# Patient Record
Sex: Male | Born: 1965
Health system: Southern US, Community
[De-identification: ages and names within clinical notes are randomized; demographics above are authoritative.]

## PROBLEM LIST (undated history)

## (undated) DIAGNOSIS — K219 Gastro-esophageal reflux disease without esophagitis: Secondary | ICD-10-CM

## (undated) DIAGNOSIS — T7840XA Allergy, unspecified, initial encounter: Secondary | ICD-10-CM

## (undated) DIAGNOSIS — H8109 Meniere's disease, unspecified ear: Secondary | ICD-10-CM

## (undated) DIAGNOSIS — I10 Essential (primary) hypertension: Secondary | ICD-10-CM

## (undated) DIAGNOSIS — R7303 Prediabetes: Secondary | ICD-10-CM

## (undated) DIAGNOSIS — Z8619 Personal history of other infectious and parasitic diseases: Secondary | ICD-10-CM

## (undated) DIAGNOSIS — K469 Unspecified abdominal hernia without obstruction or gangrene: Secondary | ICD-10-CM

## (undated) DIAGNOSIS — E785 Hyperlipidemia, unspecified: Secondary | ICD-10-CM

## (undated) HISTORY — PX: HERNIA REPAIR: SHX51

## (undated) HISTORY — DX: Personal history of other infectious and parasitic diseases: Z86.19

## (undated) HISTORY — DX: Unspecified abdominal hernia without obstruction or gangrene: K46.9

## (undated) HISTORY — DX: Meniere's disease, unspecified ear: H81.09

## (undated) HISTORY — DX: Prediabetes: R73.03

## (undated) HISTORY — DX: Hyperlipidemia, unspecified: E78.5

## (undated) HISTORY — DX: Gastro-esophageal reflux disease without esophagitis: K21.9

## (undated) HISTORY — DX: Allergy, unspecified, initial encounter: T78.40XA

## (undated) HISTORY — DX: Essential (primary) hypertension: I10

---

## 2008-12-05 ENCOUNTER — Encounter: Payer: Self-pay | Admitting: Family Medicine

## 2009-01-01 ENCOUNTER — Ambulatory Visit: Payer: Self-pay | Admitting: Family Medicine

## 2009-01-01 DIAGNOSIS — R51 Headache: Secondary | ICD-10-CM | POA: Insufficient documentation

## 2009-01-01 DIAGNOSIS — R519 Headache, unspecified: Secondary | ICD-10-CM | POA: Insufficient documentation

## 2009-01-01 DIAGNOSIS — I10 Essential (primary) hypertension: Secondary | ICD-10-CM | POA: Insufficient documentation

## 2009-02-05 ENCOUNTER — Ambulatory Visit: Payer: Self-pay | Admitting: Family Medicine

## 2009-02-05 DIAGNOSIS — E785 Hyperlipidemia, unspecified: Secondary | ICD-10-CM | POA: Insufficient documentation

## 2009-02-05 DIAGNOSIS — K4090 Unilateral inguinal hernia, without obstruction or gangrene, not specified as recurrent: Secondary | ICD-10-CM | POA: Insufficient documentation

## 2009-05-07 ENCOUNTER — Ambulatory Visit: Payer: Self-pay | Admitting: Family Medicine

## 2009-05-07 DIAGNOSIS — N529 Male erectile dysfunction, unspecified: Secondary | ICD-10-CM | POA: Insufficient documentation

## 2009-05-07 DIAGNOSIS — N410 Acute prostatitis: Secondary | ICD-10-CM | POA: Insufficient documentation

## 2009-05-07 DIAGNOSIS — R369 Urethral discharge, unspecified: Secondary | ICD-10-CM | POA: Insufficient documentation

## 2009-05-12 LAB — CONVERTED CEMR LAB
Chlamydia, DNA Probe: NEGATIVE
GC Probe Amp, Genital: NEGATIVE

## 2009-06-02 ENCOUNTER — Ambulatory Visit: Payer: Self-pay | Admitting: Surgery

## 2009-10-21 ENCOUNTER — Telehealth (INDEPENDENT_AMBULATORY_CARE_PROVIDER_SITE_OTHER): Payer: Self-pay | Admitting: *Deleted

## 2009-10-22 ENCOUNTER — Ambulatory Visit: Payer: Self-pay | Admitting: Family Medicine

## 2009-10-26 LAB — CONVERTED CEMR LAB
ALT: 26 units/L (ref 0–53)
AST: 21 units/L (ref 0–37)
BUN: 25 mg/dL — ABNORMAL HIGH (ref 6–23)
CO2: 29 meq/L (ref 19–32)
Chloride: 102 meq/L (ref 96–112)
Cholesterol: 202 mg/dL — ABNORMAL HIGH (ref 0–200)
Creatinine, Ser: 0.8 mg/dL (ref 0.4–1.5)
Direct LDL: 127.9 mg/dL
Glucose, Bld: 113 mg/dL — ABNORMAL HIGH (ref 70–99)
PSA: 2.47 ng/mL (ref 0.10–4.00)
Potassium: 4 meq/L (ref 3.5–5.1)
Total Protein: 7.2 g/dL (ref 6.0–8.3)

## 2009-10-29 ENCOUNTER — Ambulatory Visit: Payer: Self-pay | Admitting: Family Medicine

## 2009-10-31 ENCOUNTER — Telehealth: Payer: Self-pay | Admitting: Family Medicine

## 2009-11-14 ENCOUNTER — Encounter: Payer: Self-pay | Admitting: Family Medicine

## 2009-12-31 ENCOUNTER — Ambulatory Visit: Payer: Self-pay | Admitting: Family Medicine

## 2010-06-09 NOTE — Progress Notes (Signed)
Summary: prior auth needed for cialis  Phone Note From Pharmacy   Caller: [facility]*/ [employer] Summary of Call: Prior Berkley Harvey is needed for cialis, form is on your desk. Initial call taken by: Lowella Petties CMA,  October 31, 2009 3:00 PM  Follow-up for Phone Call        noted Follow-up by: Hannah Beat MD,  October 31, 2009 3:09 PM

## 2010-06-09 NOTE — Medication Information (Signed)
Summary: Denial for Cialis/[employer]  Denial for Cialis/[employer]   Imported By: Lanelle Bal 11/20/2009 11:23:33  _____________________________________________________________________  External Attachment:    Type:   Image     Comment:   External Document

## 2010-06-09 NOTE — Assessment & Plan Note (Signed)
Summary: CPX/DLO   Vital Signs:  Patient profile:   45 year old male Height:      67.75 inches Weight:      198.2 pounds BMI:     30.47 Temp:     98.6 degrees F oral Pulse rate:   76 / minute Pulse rhythm:   regular BP sitting:   140 / 98  (left arm) Cuff size:   regular  Vitals Entered By: Benny Lennert CMA Duncan Dull) (October 29, 2009 2:33 PM)  History of Present Illness: Chief complaint cpx  HTN: will recheck, 145/100. The patient is to use been compliant with his medications, liver his bottle indicates it was last filled in February. He has concerns that his current hydrochlorothiazide may be impairing his erections.  Prostatitis, the patient has some complaints about a pain  in the perineal area, and also notably has some discomfort at times during ejaculation. He is had this in the past, and I previously did treat him with Cipro for a month, and he states he had no symptoms during that treatment time period, however they also believe did return. no penile discharge, previous gonorrhea and Chlamydia testing is been normal.  ED, extensively discussed. The patient is able to achieve a satisfactory erection, however he does  lose his erection  often,  in  sometimes is unable to complete the act of intercourse. He is able to ejaculate.  He also has some concerns and some mild pain around his inguinal area  and locations prior  hernia which had been present for approximately 20 years  CPX:  Sometimes wil get an erection and sometimes else will   Preventive Screening-Counseling & Management  Alcohol-Tobacco     Alcohol drinks/day: 0     Alcohol Counseling: not indicated; patient does not drink     Smoking Status: never     Tobacco Counseling: not indicated; no tobacco use  Caffeine-Diet-Exercise     Diet Counseling: to improve diet; diet is suboptimal     Does Patient Exercise: yes     Type of exercise: weights, some cardio     Times/week: <3  Hep-HIV-STD-Contraception  HIV Risk: no risk noted     STD Risk: no risk noted     Testicular SE Education/Counseling to perform regular STE      Sexual History:  currently monogamous.        Drug Use:  never.    Clinical Review Panels:  Prevention   Last PSA:  2.47 (10/22/2009)  Immunizations   Last Flu Vaccine:  Fluvax 3+ (02/05/2009)  Lipid Management   Cholesterol:  202 (10/22/2009)   HDL (good cholesterol):  43.40 (10/22/2009)  Complete Metabolic Panel   Glucose:  113 (10/22/2009)   Sodium:  139 (10/22/2009)   Potassium:  4.0 (10/22/2009)   Chloride:  102 (10/22/2009)   CO2:  29 (10/22/2009)   BUN:  25 (10/22/2009)   Creatinine:  0.8 (10/22/2009)   Albumin:  4.6 (10/22/2009)   Total Protein:  7.2 (10/22/2009)   Calcium:  9.5 (10/22/2009)   Total Bili:  1.0 (10/22/2009)   Alk Phos:  69 (10/22/2009)   SGPT (ALT):  26 (10/22/2009)   SGOT (AST):  21 (10/22/2009)   Allergies (verified): No Known Drug Allergies  Past History:  Past medical, surgical, family and social histories (including risk factors) reviewed, and no changes noted (except as noted below).  Past Medical History: Chicken pox Hypertension large, right inguinal hernia, s/p repair Hyperlipidemia  Past Surgical History: Inguinal  herniorrhaphy  Family History: Reviewed history from 01/01/2009 and no changes required. Family History Breast cancer 1st degree relative <50 Family History Diabetes 1st degree relative Family History Hypertension  Social History: Reviewed history from 01/01/2009 and no changes required. Occupation: [employer] Married Never Smoked Alcohol use-yes Drug use-no Regular exercise-yes HIV Risk:  no risk noted STD Risk:  no risk noted Sexual History:  currently monogamous Drug Use:  never  Review of Systems      See HPI General:  Denies chills and fever. Eyes:  Denies blurring and eye irritation. ENT:  Denies postnasal drainage, sinus pressure, and sore throat. CV:  Denies chest pain  or discomfort and shortness of breath with exertion. Resp:  Denies cough and wheezing. GI:  Denies abdominal pain, bloody stools, dark tarry stools, nausea, and vomiting. GU:  Complains of decreased libido and erectile dysfunction; denies discharge, dysuria, genital sores, urinary frequency, and urinary hesitancy. MS:  Denies joint pain. Derm:  Denies rash. Neuro:  Complains of headaches; denies tingling. Psych:  Denies depression. Endo:  Denies excessive hunger, excessive thirst, and polyuria. Heme:  Denies enlarge lymph nodes and skin discoloration. Allergy:  Denies persistent infections and seasonal allergies.  Physical Exam  General:  Well-developed,well-nourished,in no acute distress; alert,appropriate and cooperative throughout examination Head:  Normocephalic and atraumatic without obvious abnormalities. No apparent alopecia or balding. Eyes:  pupils equal, pupils round, pupils reactive to light, pupils react to accomodation, and corneas and lenses clear.   Ears:  External ear exam shows no significant lesions or deformities.  Otoscopic examination reveals clear canals, tympanic membranes are intact bilaterally without bulging, retraction, inflammation or discharge. Hearing is grossly normal bilaterally. Nose:  External nasal examination shows no deformity or inflammation. Nasal mucosa are pink and moist without lesions or exudates. Mouth:  Oral mucosa and oropharynx without lesions or exudates.  Teeth in good repair. Neck:  No deformities, masses, or tenderness noted. Chest Wall:  No deformities, masses, tenderness or gynecomastia noted. Lungs:  Normal respiratory effort, chest expands symmetrically. Lungs are clear to auscultation, no crackles or wheezes. Heart:  Normal rate and regular rhythm. S1 and S2 normal without gallop, murmur, click, rub or other extra sounds. Abdomen:  Bowel sounds positive,abdomen soft and non-tender without masses, organomegaly or hernias noted. incisions  look good, well healed. Rectal:  No external abnormalities noted. Normal sphincter tone. No rectal masses or tenderness. Genitalia:  no cutaneous lesions and no urethral discharge.   Large R testicle, but no palpable lesion or mass on surface - area of prior large hernia with pointing to area of inguinal canal for area of concern for some pain Prostate:  large prostate, mildly tender to palpation, not hot. no nodules. Msk:  normal ROM and no crepitation.   Extremities:  No clubbing, cyanosis, edema, or deformity noted with normal full range of motion of all joints.   Neurologic:  alert & oriented X3, sensation intact to light touch, and gait normal.   Skin:  Intact without suspicious lesions or rashes Cervical Nodes:  No lymphadenopathy noted Inguinal Nodes:  No significant adenopathy Psych:  moderately anxious appearing. normally interactive, good eye contact, not depressed appearing, and not agitated.     Impression & Recommendations:  Problem # 1:  HEALTH MAINTENANCE EXAM (ICD-V70.0) Assessment New The patient's preventative maintenance and recommended screening tests for an annual wellness exam were reviewed in full today. Brought up to date unless services declined.  Counselled on the importance of diet, exercise, and its role  in overall health and mortality. The patient's FH and SH was reviewed, including their home life, tobacco status, and drug and alcohol status.   Prediabetic and obese -- counselled about exercise patterns, diet, and weight loss.  Problem # 2:  ORGANIC IMPOTENCE (ZOX-096.04) Assessment: New >30 minutes spent in face to face time with patient in counselling: we underwent extensive counseling about erectile dysfunction, reasons for organic and psychogenic impotence. The also discussed hypertensive agents and their potential role in this. We also did discuss various treatment options including  Cialis,Levitra, Viagra, and daily Cialis. counseling time done in total  including  counseling about blood pressure and hypertensive agents.  The patient appeared highly anxious. Likely organic eminence, however but I cannot rule out a significant component of psychogenic impotence as well.  His updated medication list for this problem includes:    Cialis 20 Mg Tabs (Tadalafil) .Marland Kitchen... 1 by mouth prior to intercourse (sample pack)    Cialis 20 Mg Tabs (Tadalafil) .Marland Kitchen... 1 by mouth prior to intercourse  Problem # 3:  PROSTATITIS, ACUTE, CHRONIC (ICD-601.0) Assessment: Deteriorated mildly tender prostate on exam, and certainly has symptoms that will go along with this, so we'll treat as such. The patient has multiple genital complaints, and multiple sexual complaints, this may be  sexual dysfunction issue and sexual pain issue,  and if this does not resolve after this course of ciprofloxacin, I willhave him see urology.  Problem # 4:  HYPERTENSION (ICD-401.9) Assessment: Deteriorated unstable, previously has tried lisinopril and hydrochlorothiazide, both of which the patient was concerned were causing  erectile difficulties.  Will change to Norvasc to attempt to achieve good blood pressure control.  The following medications were removed from the medication list:    Hydrochlorothiazide 25 Mg Tabs (Hydrochlorothiazide) .Marland Kitchen... 1 by mouth daily His updated medication list for this problem includes:    Amlodipine Besylate 10 Mg Tabs (Amlodipine besylate) .Marland Kitchen... 1 by mouth daily  Problem # 5:  INGUINAL HERNIA (ICD-550.90) Assessment: Improved repair site looks good, reassured the patient.  Complete Medication List: 1)  One-a-day Mens Tabs (Multiple vitamin) .... One a day 2)  Amlodipine Besylate 10 Mg Tabs (Amlodipine besylate) .Marland Kitchen.. 1 by mouth daily 3)  Cialis 20 Mg Tabs (Tadalafil) .Marland Kitchen.. 1 by mouth prior to intercourse (sample pack) 4)  Cialis 20 Mg Tabs (Tadalafil) .Marland Kitchen.. 1 by mouth prior to intercourse 5)  Cipro 500 Mg Tabs (Ciprofloxacin hcl) .Marland Kitchen.. 1 by mouth 2 times  daily Prescriptions: CIPRO 500 MG TABS (CIPROFLOXACIN HCL) 1 by mouth 2 times daily  #60 x 0   Entered and Authorized by:   Hannah Beat MD   Signed by:   Hannah Beat MD on 10/29/2009   Method used:   Electronically to        [facility]* (retail)       [address]       [place], [place]  [address]       Ph: [phone]       Fax: [phone]   RxID:   [id number] CIALIS 20 MG TABS (TADALAFIL) 1 by mouth prior to intercourse  #10 x 11   Entered and Authorized by:   Hannah Beat MD   Signed by:   Hannah Beat MD on 10/29/2009   Method used:   Print then Give to Patient   RxID:   [id number] CIALIS 20 MG TABS (TADALAFIL) 1 by mouth prior to intercourse (sample pack)  #3 x 0   Entered and Authorized by:  Hannah Beat MD   Signed by:   Hannah Beat MD on 10/29/2009   Method used:   Electronically to        [facility]* (retail)       [address]       [place], [place]  [address]       Ph: [phone]       Fax: [phone]   RxID:   [id number] AMLODIPINE BESYLATE 10 MG TABS (AMLODIPINE BESYLATE) 1 by mouth daily  #30 x 11   Entered and Authorized by:   Hannah Beat MD   Signed by:   Hannah Beat MD on 10/29/2009   Method used:   Electronically to        [facility]* (retail)       [address]       [place], [place]  [address]       Ph: [phone]       Fax: [phone]   RxID:   [id number]   Current Allergies (reviewed today): No known allergies

## 2010-06-09 NOTE — Assessment & Plan Note (Signed)
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 ED: good response to cialis  3. prostatitis: continues with questions, but is asymptomatic. No problems after course of cipro   REVIEW OF SYSTEMS GEN: No acute illnesses, no fever, chills, sweats. CV: No chest pain or SOB GI: No noted N or V Otherwise, pertinent positives and negatives are noted in the HPI.   GEN: WDWN, NAD, Non-toxic, A & O x 3 HEENT: Atraumatic, Normocephalic. Neck supple. No masses, No LAD. Ears and Nose: No external deformity. CV: RRR, No M/G/R. No JVD. No thrill. No extra heart sounds. PULM: CTA B, no wheezes, crackles, rhonchi. No retractions. No resp. distress. No accessory muscle use.  EXTR: No c/c/e NEURO: Normal gait.  PSYCH: Normally interactive. Conversant. Not depressed or anxious appearing.  Calm demeanor.    Allergies (verified): No Known Drug Allergies  Past History:  Past medical, surgical, family and social histories (including risk factors) reviewed, and no changes noted (except as noted below).  Past Medical History: Reviewed history from 10/29/2009 and no changes required. Chicken pox Hypertension large, right inguinal hernia, s/p repair Hyperlipidemia  Past Surgical History: Reviewed history from 10/29/2009 and no changes required. Inguinal herniorrhaphy  Family History: Reviewed history from 01/01/2009 and no changes required. Family History Breast cancer 1st degree  relative <50 Family History Diabetes 1st degree relative Family History Hypertension  Social History: Reviewed history from 01/01/2009 and no changes required. Occupation: [employer] Married Never Smoked Alcohol use-yes Drug use-no Regular exercise-yes   Impression & Recommendations:  Problem # 1:  HYPERTENSION (ICD-401.9) Assessment Deteriorated add toprol  His updated medication list for this problem includes:    Amlodipine Besylate 10 Mg Tabs (Amlodipine besylate) .Marland Kitchen... 1 by mouth daily    Metoprolol Tartrate 25 Mg Tabs (Metoprolol tartrate) .Marland Kitchen... 1/2 tablet by mouth two times a day  Problem # 2:  PROSTATITIS, ACUTE, CHRONIC (ICD-601.0) Assessment: Improved resolved  Problem # 3:  ORGANIC IMPOTENCE (WUX-324.40) Assessment: Improved  The following medications were removed from the medication list:    Cialis 20 Mg Tabs (Tadalafil) .Marland Kitchen... 1 by mouth prior to intercourse (sample pack) His updated medication list for this problem includes:    Cialis 20 Mg Tabs (Tadalafil) .Marland Kitchen... 1 by mouth prior to intercourse  Complete Medication List: 1)  Amlodipine Besylate 10 Mg Tabs (Amlodipine besylate) .Marland Kitchen.. 1 by mouth daily 2)  Cialis 20 Mg Tabs (Tadalafil) .Marland Kitchen.. 1 by mouth prior to intercourse 3)  Metoprolol Tartrate 25 Mg Tabs (Metoprolol tartrate) .... 1/2 tablet by mouth two times a day  Patient Instructions: 1)  Metoprolol: 1/2 tablet in the morning and 1/2 tablet in the late afternoon Prescriptions: METOPROLOL TARTRATE 25 MG TABS (METOPROLOL TARTRATE) 1/2 tablet by mouth two times a day  #30 x 11   Entered and  Authorized by:   Hannah Beat MD   Signed by:   Hannah Beat MD on 12/31/2009   Method used:   Electronically to        [facility]* (retail)       [address]       [place], [place]  [address]       Ph: [phone]       Fax: [phone]   RxID:   [id number]   Current Allergies (reviewed today): No known allergies

## 2010-06-09 NOTE — Progress Notes (Signed)
----   Converted from flag ---- ---- 10/21/2009 11:43 AM, Kerby Nora MD wrote: CMET, lipids Dx v77.91  ---- 10/21/2009 11:32 AM, Mills Koller wrote: Dr Cyndie Chime pt-CPX lab order, please. Terri ------------------------------

## 2010-06-12 ENCOUNTER — Encounter: Payer: Self-pay | Admitting: Family Medicine

## 2010-06-12 ENCOUNTER — Ambulatory Visit (INDEPENDENT_AMBULATORY_CARE_PROVIDER_SITE_OTHER): Payer: Commercial Indemnity | Admitting: Family Medicine

## 2010-06-12 DIAGNOSIS — J019 Acute sinusitis, unspecified: Secondary | ICD-10-CM

## 2010-06-17 NOTE — Assessment & Plan Note (Signed)
Summary: ?SINUS INFECTION/CLE [employer]   Vital Signs:  Patient profile:   45 year old male Weight:      202 pounds Temp:     98.4 degrees F oral Pulse rate:   60 / minute Pulse rhythm:   regular BP sitting:   118 / 80  (left arm) Cuff size:   large  Vitals Entered By: Selena Batten Dance CMA Duncan Dull) (June 12, 2010 4:29 PM) CC: ? SInus infection x1 1/2 weeks Comments Took 1 Avelox this AM that his wife had   History of Present Illness: CC: sinus pressure?  1 1/2 wk h/o sinus pressure in head.  Actually sxs started 3-4 wks ago, took zpack and got better.  then wife got it and now pt starting to get sxs again.  Tried OTC meds - allergy which have helped some.  + tooth pain and ears popping.  + PNdrip.  + yellow discharge from nose.  very mild cough.  pressure worse with bending head forward.  No fevers/chills, abd pain, n/v/d, rashes, chest pain, ear pain.  No ST.  No smokers at home, no asthma.  Current Medications (verified): 1)  Amlodipine Besylate 10 Mg Tabs (Amlodipine Besylate) .Marland Kitchen.. 1 By Mouth Daily 2)  Metoprolol Tartrate 25 Mg Tabs (Metoprolol Tartrate) .... 1/2 Tablet By Mouth Two Times A Day  Allergies (verified): 1)  ! Penicillin  Past History:  Past Medical History: Last updated: 10/29/2009 Chicken pox Hypertension large, right inguinal hernia, s/p repair Hyperlipidemia  Social History: Last updated: 01/01/2009 Occupation: [employer] Married Never Smoked Alcohol use-yes Drug use-no Regular exercise-yes  Review of Systems       per HPI  Physical Exam  General:  Well-developed,well-nourished,in no acute distress; alert,appropriate and cooperative throughout examination Head:  Normocephalic and atraumatic without obvious abnormalities. no sinus tenderness Eyes:  pupils equal, pupils round, pupils reactive to light, pupils react to accomodation, and corneas and lenses clear.   Ears:  TMs clear bilaterally Nose:  nares clear bilaterally, some  congestion Mouth:  MMM, mild pharyngeal erythema bilaterally Neck:  No deformities, masses, or tenderness noted.  no LAD Lungs:  Normal respiratory effort, chest expands symmetrically. Lungs are clear to auscultation, no crackles or wheezes. Heart:  Normal rate and regular rhythm. S1 and S2 normal without gallop, murmur, click, rub or other extra sounds. Pulses:  2+ rad pulses, brisk cap refill Extremities:  no pedal edema Skin:  Intact without suspicious lesions or rashes   Impression & Recommendations:  Problem # 1:  SINUSITIS, ACUTE (ICD-461.9) Assessment New advised against taking other's abx.  advised to tell wife to complete all abx course.  possible mild early sinus infection, going on past 10 days, treat.  advised to complete abx course.  supportive care as per instructions. His updated medication list for this problem includes:    Bactrim Ds 800-160 Mg Tabs (Sulfamethoxazole-trimethoprim) .Marland Kitchen... Take one by mouth two times a day x 10 days  Complete Medication List: 1)  Amlodipine Besylate 10 Mg Tabs (Amlodipine besylate) .Marland Kitchen.. 1 by mouth daily 2)  Metoprolol Tartrate 25 Mg Tabs (Metoprolol tartrate) .... 1/2 tablet by mouth two times a day 3)  Bactrim Ds 800-160 Mg Tabs (Sulfamethoxazole-trimethoprim) .... Take one by mouth two times a day x 10 days  Patient Instructions: 1)  You have a sinus infection. 2)  Take medicines as prescribed: bactrim DS twice daily for 10 days 3)  Take simple mucinex with plenty of fluid to help mobilize mucous. 4)  Use  nasal saline spray to help drainage of sinuses. 5)  If you start having fevers >101.5, trouble swallowing or breathing, or are worsening instead of improving as expected, you may need to be seen again. 6)  Good to see you today, call clinic with questions.  Prescriptions: BACTRIM DS 800-160 MG TABS (SULFAMETHOXAZOLE-TRIMETHOPRIM) take one by mouth two times a day x 10 days  #20 x 0   Entered and Authorized by:   Eustaquio Boyden  MD    Signed by:   Eustaquio Boyden  MD on 06/12/2010   Method used:   Electronically to        [facility]* (retail)       [address]       [place], [place]  [address]       Ph: [phone]       Fax: [phone]   RxID:   [id number]    Orders Added: 1)  Est. Patient Level III [75643]    Current Allergies (reviewed today): ! PENICILLIN

## 2010-10-23 ENCOUNTER — Other Ambulatory Visit: Payer: Self-pay | Admitting: *Deleted

## 2010-10-23 MED ORDER — AMLODIPINE BESYLATE 10 MG PO TABS: 10.0000 mg | ORAL_TABLET | Freq: Every day | ORAL | Status: DC

## 2010-12-23 ENCOUNTER — Other Ambulatory Visit: Payer: Self-pay | Admitting: *Deleted

## 2010-12-23 MED ORDER — AMLODIPINE BESYLATE 10 MG PO TABS: 10.0000 mg | ORAL_TABLET | Freq: Every day | ORAL | Status: DC

## 2010-12-28 ENCOUNTER — Other Ambulatory Visit: Payer: Self-pay | Admitting: *Deleted

## 2010-12-30 MED ORDER — METOPROLOL TARTRATE 25 MG PO TABS: 12.5000 mg | ORAL_TABLET | Freq: Two times a day (BID) | ORAL | Status: DC

## 2010-12-31 ENCOUNTER — Other Ambulatory Visit: Payer: Self-pay | Admitting: *Deleted

## 2011-02-09 ENCOUNTER — Other Ambulatory Visit (INDEPENDENT_AMBULATORY_CARE_PROVIDER_SITE_OTHER): Payer: Commercial Indemnity

## 2011-02-09 DIAGNOSIS — E785 Hyperlipidemia, unspecified: Secondary | ICD-10-CM

## 2011-02-09 DIAGNOSIS — Z125 Encounter for screening for malignant neoplasm of prostate: Secondary | ICD-10-CM

## 2011-02-09 DIAGNOSIS — Z79899 Other long term (current) drug therapy: Secondary | ICD-10-CM

## 2011-02-09 LAB — HEPATIC FUNCTION PANEL
ALT: 22 U/L (ref 0–53)
AST: 18 U/L (ref 0–37)
Albumin: 4.2 g/dL (ref 3.5–5.2)
Alkaline Phosphatase: 68 U/L (ref 39–117)
Total Protein: 6.8 g/dL (ref 6.0–8.3)

## 2011-02-09 LAB — BASIC METABOLIC PANEL
BUN: 26 mg/dL — ABNORMAL HIGH (ref 6–23)
CO2: 27 mEq/L (ref 19–32)
GFR: 86.18 mL/min (ref >60.00)
Glucose, Bld: 116 mg/dL — ABNORMAL HIGH (ref 70–99)
Potassium: 4.2 mEq/L (ref 3.5–5.1)
Sodium: 141 mEq/L (ref 135–145)

## 2011-02-09 LAB — CBC WITH DIFFERENTIAL/PLATELET
Basophils Relative: 0.5 % (ref 0.0–3.0)
Eosinophils Relative: 1.2 % (ref 0.0–5.0)
HCT: 45.6 % (ref 39.0–52.0)
MCV: 87.8 fl (ref 78.0–100.0)
Monocytes Absolute: 0.4 10*3/uL (ref 0.1–1.0)
Monocytes Relative: 7.7 % (ref 3.0–12.0)
Neutrophils Relative %: 56.7 % (ref 43.0–77.0)
RBC: 5.2 Mil/uL (ref 4.22–5.81)
WBC: 5.5 10*3/uL (ref 4.5–10.5)

## 2011-02-09 LAB — LIPID PANEL: Total CHOL/HDL Ratio: 4

## 2011-02-10 ENCOUNTER — Other Ambulatory Visit: Payer: Commercial Indemnity

## 2011-02-12 ENCOUNTER — Encounter: Payer: Self-pay | Admitting: Family Medicine

## 2011-02-15 ENCOUNTER — Ambulatory Visit (INDEPENDENT_AMBULATORY_CARE_PROVIDER_SITE_OTHER): Payer: Commercial Indemnity | Admitting: Family Medicine

## 2011-02-15 ENCOUNTER — Encounter: Payer: Self-pay | Admitting: Family Medicine

## 2011-02-15 VITALS — BP 120/70 | HR 58 | Temp 98.7°F | Ht 68.0 in | Wt 197.8 lb

## 2011-02-15 DIAGNOSIS — N529 Male erectile dysfunction, unspecified: Secondary | ICD-10-CM

## 2011-02-15 DIAGNOSIS — Z0000 Encounter for general adult medical examination without abnormal findings: Secondary | ICD-10-CM

## 2011-02-15 DIAGNOSIS — Z23 Encounter for immunization: Secondary | ICD-10-CM

## 2011-02-15 DIAGNOSIS — N400 Enlarged prostate without lower urinary tract symptoms: Secondary | ICD-10-CM

## 2011-02-15 DIAGNOSIS — I10 Essential (primary) hypertension: Secondary | ICD-10-CM

## 2011-02-15 DIAGNOSIS — N410 Acute prostatitis: Secondary | ICD-10-CM

## 2011-02-15 DIAGNOSIS — R972 Elevated prostate specific antigen [PSA]: Secondary | ICD-10-CM

## 2011-02-15 MED ORDER — VARDENAFIL HCL 20 MG PO TABS: 20.0000 mg | ORAL_TABLET | ORAL | Status: DC | PRN

## 2011-02-15 MED ORDER — METOPROLOL TARTRATE 25 MG PO TABS: 25.0000 mg | ORAL_TABLET | Freq: Two times a day (BID) | ORAL | Status: DC

## 2011-02-15 MED ORDER — SULFAMETHOXAZOLE-TMP DS 800-160 MG PO TABS: 1.0000 | ORAL_TABLET | Freq: Two times a day (BID) | ORAL | Status: AC

## 2011-02-15 NOTE — Patient Instructions (Signed)
REFERRAL: GO THE THE FRONT ROOM AT THE ENTRANCE OF OUR CLINIC, NEAR CHECK IN. ASK FOR MARION. SHE WILL HELP YOU SET UP YOUR REFERRAL. DATE: TIME:  

## 2011-02-15 NOTE — Progress Notes (Signed)
Subjective:    Patient ID: [name], male    DOB: 26-May-1965, 45 y.o.   MRN: [id number]  HPI  Thomas Blair, a 45 y.o. male presents today in the office for the following:    Preventative Health Maintenance Visit:  Health Maintenance Summary Reviewed and updated, unless pt declines services.  Tobacco History Reviewed. Alcohol: No concerns, no excessive use Exercise Habits: Some activity, rec at least 30 mins 5 times a week STD concerns: no risk or activity to increase risk Drug Use: None Encouraged self-testicular check  Health Maintenance  Topic Date Due  . Tetanus/tdap  04/08/1983  . Influenza Vaccine  02/08/2011    Labs reviewed with the patient.   Lipids:    Component Value Date/Time   CHOL 164 02/09/2011 0834   TRIG 85.0 02/09/2011 0834   HDL 39.40 02/09/2011 0834   LDLDIRECT 127.9 10/22/2009 0840   VLDL 17.0 02/09/2011 0834   CHOLHDL 4 02/09/2011 0834    CBC:    Component Value Date/Time   WBC 5.5 02/09/2011 0834   HGB 15.3 02/09/2011 0834   HCT 45.6 02/09/2011 0834   PLT 178.0 02/09/2011 0834   MCV 87.8 02/09/2011 0834   NEUTROABS 3.1 02/09/2011 0834   LYMPHSABS 1.9 02/09/2011 0834   MONOABS 0.4 02/09/2011 0834   EOSABS 0.1 02/09/2011 0834   BASOSABS 0.0 02/09/2011 0834    Basic Metabolic Panel:    Component Value Date/Time   NA 141 02/09/2011 0834   K 4.2 02/09/2011 0834   CL 106 02/09/2011 0834   CO2 27 02/09/2011 0834   BUN 26* 02/09/2011 0834   CREATININE 1.0 02/09/2011 0834   GLUCOSE 116* 02/09/2011 0834   CALCIUM 9.2 02/09/2011 0834    Lab Results  Component Value Date   ALT 22 02/09/2011   AST 18 02/09/2011   ALKPHOS 68 02/09/2011   BILITOT 1.0 02/09/2011    Lab Results  Component Value Date   PSA 15.88* 02/09/2011   PSA 2.47 10/22/2009   Elevated PSA: The patient's PSA is increased to 16 from 2.5 one year ago. He does have some complaints of discomfort in around his prostate as well as in his perineal area. No pain with ejaculation or  urination.  Organic impotence, continues. Thomas Blair does work, but is not covered on his insurance. He like try one of the other products.  Hypertension: Elevated, classically has been slightly above 140/90 on his checks, and when I rechecked it in the office it was 145/95.    Review of Systems .General: Denies fever, chills, sweats. No significant weight loss. Eyes: Denies blurring,significant itching ENT: Denies earache, sore throat, and hoarseness. Cardiovascular: Denies chest pains, palpitations, dyspnea on exertion Respiratory: Denies cough, dyspnea at rest,wheeezing Breast: no concerns about lumps GI: Denies nausea, vomiting, diarrhea, constipation, change in bowel habits, abdominal pain, melena, hematochezia GU: above Musculoskeletal: Denies back pain, joint pain Derm: Denies rash, itching Neuro: Denies  paresthesias, frequent falls, frequent headaches Psych: Denies depression, anxiety Endocrine: Denies cold intolerance, heat intolerance, polydipsia Heme: Denies enlarged lymph nodes Allergy: No hayfever     Objective:   Physical Exam   Physical Exam  Blood pressure 120/70, pulse 58, temperature 98.7 F (37.1 C), temperature source Oral, height 5\' 8"  (1.727 m), weight 197 lb 12.8 oz (89.721 kg), SpO2 99.00%.  PE: GEN: well developed, well nourished, no acute distress Eyes: conjunctiva and lids normal, PERRLA, EOMI ENT: TM clear, nares clear, oral exam WNL Neck: supple, no lymphadenopathy, no thyromegaly,  no JVD Pulm: clear to auscultation and percussion, respiratory effort normal CV: regular rate and rhythm, S1-S2, no murmur, rub or gallop, no bruits, peripheral pulses normal and symmetric, no cyanosis, clubbing, edema or varicosities Chest: no scars, masses, no gynecomastia   GI: soft, non-tender; no hepatosplenomegaly, masses; active bowel sounds all quadrants GU: no hernia, testicular mass, penile discharge. Prostate is moderately enlarged and mildly boggy. It is also  mildly tender to palpation. Lymph: no cervical, axillary or inguinal adenopathy MSK: gait normal, muscle tone and strength WNL, no joint swelling, effusions, discoloration, crepitus  SKIN: clear, good turgor, color WNL, no rashes, lesions, or ulcerations Neuro: normal mental status, normal strength, sensation, and motion Psych: alert; oriented to person, place and time, normally interactive and not anxious or depressed in appearance.      Assessment & Plan:   1. Routine general medical examination at a health care facility : The patient's preventative maintenance and recommended screening tests for an annual wellness exam were reviewed in full today. Brought up to date unless services declined.  Counselled on the importance of diet, exercise, and its role in overall health and mortality. The patient's FH and SH was reviewed, including their home life, tobacco status, and drug and alcohol status.    2. Flu vaccine need  Flu vaccine greater than or equal to 3yo preservative free IM  3. HYPERTENSION : increase lopressor dose metoprolol tartrate (LOPRESSOR) 25 MG tablet  4. Elevated PSA measurement : The patient may have prostatitis based on his exam and history. I am concerned given his long-standing and multiple complaints about this area of his anatomy, and the rapid acceleration of that PSA. I think it is most prudent to have urological input.  Ambulatory referral to Urology  5. Enlarged prostate  Ambulatory referral to Urology  6. PROSTATITIS, ACUTE, CHRONIC    7. ORGANIC IMPOTENCE : Trial of Levitra

## 2011-02-17 ENCOUNTER — Telehealth: Payer: Self-pay | Admitting: *Deleted

## 2011-02-17 NOTE — Telephone Encounter (Signed)
Form faxed

## 2011-02-17 NOTE — Telephone Encounter (Signed)
done

## 2011-02-17 NOTE — Telephone Encounter (Signed)
Prior Berkley Harvey is needed for levitra, form is on your desk.

## 2011-04-14 ENCOUNTER — Other Ambulatory Visit: Payer: Self-pay | Admitting: Family Medicine

## 2011-05-11 ENCOUNTER — Other Ambulatory Visit: Payer: Self-pay | Admitting: Family Medicine

## 2011-06-10 ENCOUNTER — Other Ambulatory Visit: Payer: Self-pay | Admitting: Family Medicine

## 2011-07-05 ENCOUNTER — Other Ambulatory Visit: Payer: Self-pay | Admitting: Family Medicine

## 2011-08-09 ENCOUNTER — Other Ambulatory Visit: Payer: Self-pay | Admitting: Family Medicine

## 2011-08-13 ENCOUNTER — Ambulatory Visit (INDEPENDENT_AMBULATORY_CARE_PROVIDER_SITE_OTHER): Payer: Commercial Indemnity | Admitting: Family Medicine

## 2011-08-13 ENCOUNTER — Encounter: Payer: Self-pay | Admitting: Family Medicine

## 2011-08-13 VITALS — BP 116/80 | HR 54 | Temp 98.6°F | Wt 201.0 lb

## 2011-08-13 DIAGNOSIS — R05 Cough: Secondary | ICD-10-CM

## 2011-08-13 DIAGNOSIS — R059 Cough, unspecified: Secondary | ICD-10-CM

## 2011-08-13 MED ORDER — ALBUTEROL SULFATE HFA 108 (90 BASE) MCG/ACT IN AERS: 2.0000 | INHALATION_SPRAY | Freq: Four times a day (QID) | RESPIRATORY_TRACT | Status: DC | PRN

## 2011-08-13 NOTE — Patient Instructions (Signed)
Use the inhaler every 6 hours as needed.  This should gradually get better.  Get some rest and drink plenty of fluids.

## 2011-08-13 NOTE — Progress Notes (Signed)
Sinus pressure for 8-9 days.  4-5 days with chest congestion.  Ears are popping.  Cough with some sputum.  Some wheeze.  Nonsmoker.  No fevers known, but did have sweats.  No h/o asthma.  No aches.    Meds, vitals, and allergies reviewed.   ROS: See HPI.  Otherwise, noncontributory.  GEN: nad, alert and oriented HEENT: mucous membranes moist, tm w/o erythema, nasal exam w/o erythema, clear discharge noted,  OP with cobblestoning, sinuses not ttp x4 NECK: supple w/o LA CV: rrr.   PULM: ctab, no inc wob EXT: no edema SKIN: no acute rash

## 2011-08-15 DIAGNOSIS — R059 Cough, unspecified: Secondary | ICD-10-CM | POA: Insufficient documentation

## 2011-08-15 DIAGNOSIS — R05 Cough: Secondary | ICD-10-CM | POA: Insufficient documentation

## 2011-08-15 NOTE — Assessment & Plan Note (Signed)
Possibly with UAN prev, but ctab, no wheeze and no UAN now.  Nontoxic, likely with viral process with mild ETD.  SABA sample given and used in office, with relief of cough.  Tolerated well, routine instructions given.  Supportive tx, f/u prn.  No need for abx at this point.

## 2011-09-07 ENCOUNTER — Other Ambulatory Visit: Payer: Self-pay | Admitting: Family Medicine

## 2011-10-09 ENCOUNTER — Other Ambulatory Visit: Payer: Self-pay | Admitting: Family Medicine

## 2011-11-10 ENCOUNTER — Other Ambulatory Visit: Payer: Self-pay | Admitting: Family Medicine

## 2011-12-11 ENCOUNTER — Other Ambulatory Visit: Payer: Self-pay | Admitting: Family Medicine

## 2012-01-09 ENCOUNTER — Other Ambulatory Visit: Payer: Self-pay | Admitting: Family Medicine

## 2012-02-01 ENCOUNTER — Other Ambulatory Visit: Payer: Self-pay | Admitting: Family Medicine

## 2012-02-28 ENCOUNTER — Other Ambulatory Visit (INDEPENDENT_AMBULATORY_CARE_PROVIDER_SITE_OTHER): Payer: Commercial Indemnity

## 2012-02-28 DIAGNOSIS — Z79899 Other long term (current) drug therapy: Secondary | ICD-10-CM

## 2012-02-28 DIAGNOSIS — Z125 Encounter for screening for malignant neoplasm of prostate: Secondary | ICD-10-CM

## 2012-02-28 DIAGNOSIS — Z13220 Encounter for screening for lipoid disorders: Secondary | ICD-10-CM

## 2012-02-28 LAB — CBC WITH DIFFERENTIAL/PLATELET
Basophils Absolute: 0 10*3/uL (ref 0.0–0.1)
Basophils Relative: 0.6 % (ref 0.0–3.0)
Eosinophils Absolute: 0.1 10*3/uL (ref 0.0–0.7)
Lymphocytes Relative: 37.5 % (ref 12.0–46.0)
MCHC: 33.2 g/dL (ref 30.0–36.0)
MCV: 88.3 fl (ref 78.0–100.0)
Monocytes Absolute: 0.3 10*3/uL (ref 0.1–1.0)
Neutrophils Relative %: 52.5 % (ref 43.0–77.0)
Platelets: 161 10*3/uL (ref 150.0–400.0)
RBC: 5.22 Mil/uL (ref 4.22–5.81)
RDW: 13.6 % (ref 11.5–14.6)

## 2012-02-28 LAB — BASIC METABOLIC PANEL
Chloride: 105 mEq/L (ref 96–112)
Creatinine, Ser: 0.8 mg/dL (ref 0.4–1.5)
Potassium: 4.4 mEq/L (ref 3.5–5.1)
Sodium: 139 mEq/L (ref 135–145)

## 2012-02-28 LAB — LIPID PANEL
Cholesterol: 183 mg/dL (ref 0–200)
HDL: 36.3 mg/dL — ABNORMAL LOW (ref >39.00)
LDL Cholesterol: 126 mg/dL — ABNORMAL HIGH (ref 0–99)
Total CHOL/HDL Ratio: 5
Triglycerides: 106 mg/dL (ref 0.0–149.0)
VLDL: 21.2 mg/dL (ref 0.0–40.0)

## 2012-02-28 LAB — HEPATIC FUNCTION PANEL
Albumin: 4 g/dL (ref 3.5–5.2)
Alkaline Phosphatase: 59 U/L (ref 39–117)
Total Protein: 6.8 g/dL (ref 6.0–8.3)

## 2012-03-06 ENCOUNTER — Ambulatory Visit (INDEPENDENT_AMBULATORY_CARE_PROVIDER_SITE_OTHER): Payer: Commercial Indemnity | Admitting: Family Medicine

## 2012-03-06 ENCOUNTER — Encounter: Payer: Self-pay | Admitting: Family Medicine

## 2012-03-06 ENCOUNTER — Other Ambulatory Visit: Payer: Self-pay | Admitting: Family Medicine

## 2012-03-06 VITALS — BP 128/84 | HR 60 | Temp 98.6°F | Ht 67.0 in | Wt 198.0 lb

## 2012-03-06 DIAGNOSIS — Z23 Encounter for immunization: Secondary | ICD-10-CM

## 2012-03-06 DIAGNOSIS — Z0000 Encounter for general adult medical examination without abnormal findings: Secondary | ICD-10-CM

## 2012-03-06 MED ORDER — METOPROLOL TARTRATE 25 MG PO TABS: 25.0000 mg | ORAL_TABLET | Freq: Two times a day (BID) | ORAL | Status: DC

## 2012-03-06 MED ORDER — AMLODIPINE BESYLATE 10 MG PO TABS: 10.0000 mg | ORAL_TABLET | Freq: Every day | ORAL | Status: DC

## 2012-03-06 MED ORDER — VARDENAFIL HCL 20 MG PO TABS: 20.0000 mg | ORAL_TABLET | Freq: Every day | ORAL | Status: DC | PRN

## 2012-03-06 NOTE — Addendum Note (Signed)
Addended by: Sydell Axon C on: 03/06/2012 01:37 PM   Modules accepted: Orders

## 2012-03-06 NOTE — Progress Notes (Signed)
Nature conservation officer at [facility] [address] [place] [address] Phone: [phone] Fax: [phone]  Date:  03/06/2012   Name:  Thomas Blair   DOB:  [birth year]   MRN:  [id number] Gender: male Age: 46 y.o.  PCP:  Hannah Beat, MD  Evaluating MD: Hannah Beat, MD   Chief Complaint: Annual Exam   History of Present Illness:  Thomas Blair is a 46 y.o. pleasant patient who presents with the following:  CPX:  Some Left posterior neck pain.   Preventative Health Maintenance Visit:  Health Maintenance Summary Reviewed and updated, unless pt declines services.  Tobacco History Reviewed. Alcohol: No concerns, no excessive use Exercise Habits: Some activity, rec at least 30 mins 5 times a week STD concerns: no risk or activity to increase risk Drug Use: None Encouraged self-testicular check  Health Maintenance  Topic Date Due  . Tetanus/tdap  04/08/1983  . Influenza Vaccine  01/09/2012    Labs reviewed with the patient.  Results for orders placed in visit on 02/28/12  LIPID PANEL      Component Value Range   Cholesterol 183  0 - 200 mg/dL   Triglycerides 324.4  0.0 - 149.0 mg/dL   HDL 01.02 (*) >72.53 mg/dL   VLDL 66.4  0.0 - 40.3 mg/dL   LDL Cholesterol 474 (*) 0 - 99 mg/dL   Total CHOL/HDL Ratio 5    HEPATIC FUNCTION PANEL      Component Value Range   Total Bilirubin 0.9  0.3 - 1.2 mg/dL   Bilirubin, Direct 0.2  0.0 - 0.3 mg/dL   Alkaline Phosphatase 59  39 - 117 U/L   AST 15  0 - 37 U/L   ALT 20  0 - 53 U/L   Total Protein 6.8  6.0 - 8.3 g/dL   Albumin 4.0  3.5 - 5.2 g/dL  BASIC METABOLIC PANEL      Component Value Range   Sodium 139  135 - 145 mEq/L   Potassium 4.4  3.5 - 5.1 mEq/L   Chloride 105  96 - 112 mEq/L   CO2 27  19 - 32 mEq/L   Glucose, Bld 113 (*) 70 - 99 mg/dL   BUN 20  6 - 23 mg/dL   Creatinine, Ser 0.8  0.4 - 1.5 mg/dL   Calcium 9.2  8.4 - 25.9 mg/dL   GFR 563.87  >56.43 mL/min  CBC WITH DIFFERENTIAL   Component Value Range   WBC 4.6  4.5 - 10.5 K/uL   RBC 5.22  4.22 - 5.81 Mil/uL   Hemoglobin 15.3  13.0 - 17.0 g/dL   HCT 32.9  51.8 - 84.1 %   MCV 88.3  78.0 - 100.0 fl   MCHC 33.2  30.0 - 36.0 g/dL   RDW 66.0  63.0 - 16.0 %   Platelets 161.0  150.0 - 400.0 K/uL   Neutrophils Relative 52.5  43.0 - 77.0 %   Lymphocytes Relative 37.5  12.0 - 46.0 %   Monocytes Relative 7.3  3.0 - 12.0 %   Eosinophils Relative 2.1  0.0 - 5.0 %   Basophils Relative 0.6  0.0 - 3.0 %   Neutro Abs 2.4  1.4 - 7.7 K/uL   Lymphs Abs 1.7  0.7 - 4.0 K/uL   Monocytes Absolute 0.3  0.1 - 1.0 K/uL   Eosinophils Absolute 0.1  0.0 - 0.7 K/uL   Basophils Absolute 0.0  0.0 - 0.1 K/uL  PSA  Component Value Range   PSA 2.90  0.10 - 4.00 ng/mL     Patient Active Problem List  Diagnosis  . HYPERLIPIDEMIA  . HYPERTENSION  . INGUINAL HERNIA  . PROSTATITIS, ACUTE, CHRONIC  . ORGANIC IMPOTENCE  . HEADACHE  . Cough    Past Medical History  Diagnosis Date  . History of chicken pox   . Hypertension   . Hernia   . Hyperlipidemia     Past Surgical History  Procedure Date  . Hernia repair     History  Substance Use Topics  . Smoking status: Never Smoker   . Smokeless tobacco: Never Used  . Alcohol Use: Yes     weekly    No family history on file.  Allergies  Allergen Reactions  . Penicillins     REACTION: rash    Medication list has been reviewed and updated.  Outpatient Prescriptions Prior to Visit  Medication Sig Dispense Refill  . amLODipine (NORVASC) 10 MG tablet TAKE 1 TABLET BY MOUTH EVERY DAY  30 tablet  1  . metoprolol tartrate (LOPRESSOR) 25 MG tablet TAKE 1 TABLET BY MOUTH TWICE DAILY  60 tablet  10  . Multiple Vitamin (MULTIVITAMIN) tablet Take 1 tablet by mouth daily.        . vardenafil (LEVITRA) 20 MG tablet Take 1 tablet (20 mg total) by mouth as needed for erectile dysfunction.  6 tablet  11  . albuterol (PROVENTIL HFA;VENTOLIN HFA) 108 (90 BASE) MCG/ACT inhaler Inhale 2  puffs into the lungs every 6 (six) hours as needed (cough).  1 Inhaler  0    Review of Systems:   General: Denies fever, chills, sweats. No significant weight loss. Eyes: Denies blurring,significant itching ENT: Denies earache, sore throat, and hoarseness. Cardiovascular: Denies chest pains, palpitations, dyspnea on exertion Respiratory: Denies cough, dyspnea at rest,wheeezing Breast: no concerns about lumps GI: Denies nausea, vomiting, diarrhea, constipation, change in bowel habits, abdominal pain, melena, hematochezia GU: Denies penile discharge, ED, urinary flow / outflow problems. No STD concerns. Musculoskeletal: Denies back pain, joint pain - some L sided neck pain Derm: Denies rash, itching Neuro: Denies  paresthesias, frequent falls, frequent headaches Psych: Denies depression, anxiety Endocrine: Denies cold intolerance, heat intolerance, polydipsia Heme: Denies enlarged lymph nodes Allergy: No hayfever   Physical Examination: Filed Vitals:   03/06/12 0832  BP: 128/84  Pulse: 60  Temp: 98.6 F (37 C)  TempSrc: Oral  Height: 5\' 7"  (1.702 m)  Weight: 198 lb (89.812 kg)    Body mass index is 31.01 kg/(m^2). Ideal Body Weight: Weight in (lb) to have BMI = 25: 159.3    Wt Readings from Last 3 Encounters:  03/06/12 198 lb (89.812 kg)  08/13/11 201 lb (91.173 kg)  02/15/11 197 lb 12.8 oz (89.721 kg)    GEN: well developed, well nourished, no acute distress Eyes: conjunctiva and lids normal, PERRLA, EOMI ENT: TM clear, nares clear, oral exam WNL Neck: supple, no lymphadenopathy, no thyromegaly, no JVD Pulm: clear to auscultation and percussion, respiratory effort normal CV: regular rate and rhythm, S1-S2, no murmur, rub or gallop, no bruits, peripheral pulses normal and symmetric, no cyanosis, clubbing, edema or varicosities Chest: no scars, masses GI: soft, non-tender; no hepatosplenomegaly, masses; active bowel sounds all quadrants GU: no hernia, testicular  mass, penile discharge, or prostate enlargement Lymph: no cervical, axillary or inguinal adenopathy MSK: gait normal, muscle tone and strength WNL, no joint swelling, effusions, discoloration, crepitus. Neck minimally stiff, mild limitation on the  L, moving to the R laterally and with rotation SKIN: clear, good turgor, color WNL, no rashes, lesions, or ulcerations Neuro: normal mental status, normal strength, sensation, and motion Psych: alert; oriented to person, place and time, normally interactive and not anxious or depressed in appearance.   Assessment and Plan:  1. Routine general medical examination at a health care facility    The patient's preventative maintenance and recommended screening tests for an annual wellness exam were reviewed in full today. Brought up to date unless services declined.  Counselled on the importance of diet, exercise, and its role in overall health and mortality. The patient's FH and SH was reviewed, including their home life, tobacco status, and drug and alcohol status.   Refill meds Reviewed basic mckenzie style neck rehab Lose weight  Orders Today:  No orders of the defined types were placed in this encounter.    Updated Medication List: (Includes new medications, updates to list, dose adjustments) Meds ordered this encounter  Medications  . DISCONTD: vardenafil (LEVITRA) 20 MG tablet    Sig: Take 20 mg by mouth daily as needed.  Marland Kitchen ibuprofen (ADVIL,MOTRIN) 200 MG tablet    Sig: Take 200 mg by mouth every 6 (six) hours as needed.  Marland Kitchen amLODipine (NORVASC) 10 MG tablet    Sig: Take 1 tablet (10 mg total) by mouth daily.    Dispense:  30 tablet    Refill:  11  . metoprolol tartrate (LOPRESSOR) 25 MG tablet    Sig: Take 1 tablet (25 mg total) by mouth 2 (two) times daily.    Dispense:  60 tablet    Refill:  11  . vardenafil (LEVITRA) 20 MG tablet    Sig: Take 1 tablet (20 mg total) by mouth daily as needed for erectile dysfunction.     Dispense:  8 tablet    Refill:  11    Medications Discontinued: Medications Discontinued During This Encounter  Medication Reason  . albuterol (PROVENTIL HFA;VENTOLIN HFA) 108 (90 BASE) MCG/ACT inhaler Completed Course  . amLODipine (NORVASC) 10 MG tablet Reorder  . metoprolol tartrate (LOPRESSOR) 25 MG tablet Reorder  . vardenafil (LEVITRA) 20 MG tablet Reorder     Hannah Beat, MD

## 2012-10-12 ENCOUNTER — Telehealth: Payer: Self-pay

## 2012-10-12 NOTE — Telephone Encounter (Signed)
Prior auth required for Levitra; form on Dr [employer].

## 2012-10-12 NOTE — Telephone Encounter (Signed)
ok 

## 2012-10-13 NOTE — Telephone Encounter (Signed)
Pt called for status of Levitra PA; advised Rosann Auerbach form has been received; pt said going out of town 10/20/12 and would like to have med by then. Pt request cb when med approved.

## 2012-10-14 NOTE — Telephone Encounter (Signed)
i will fill out forms and that is the best we can do. If he needs asap, i have samples

## 2012-10-16 NOTE — Telephone Encounter (Signed)
Will route to rena as she started document

## 2012-10-16 NOTE — Telephone Encounter (Signed)
Spoke with Victorino Dike [phone] as urgent request; approved 10/16/12 - 10/16/13; approval # [id number]. Delice Bison at [employer] [employer] said rx went thru. Pt notified done.

## 2013-03-05 ENCOUNTER — Other Ambulatory Visit: Payer: Self-pay | Admitting: Family Medicine

## 2013-03-09 ENCOUNTER — Other Ambulatory Visit: Payer: Self-pay | Admitting: Family Medicine

## 2013-03-09 ENCOUNTER — Telehealth: Payer: Self-pay | Admitting: Family Medicine

## 2013-03-09 DIAGNOSIS — Z79899 Other long term (current) drug therapy: Secondary | ICD-10-CM

## 2013-03-09 DIAGNOSIS — E785 Hyperlipidemia, unspecified: Secondary | ICD-10-CM

## 2013-03-09 DIAGNOSIS — Z125 Encounter for screening for malignant neoplasm of prostate: Secondary | ICD-10-CM

## 2013-03-09 NOTE — Telephone Encounter (Signed)
Error

## 2013-03-12 ENCOUNTER — Other Ambulatory Visit (INDEPENDENT_AMBULATORY_CARE_PROVIDER_SITE_OTHER): Payer: Commercial Indemnity

## 2013-03-12 DIAGNOSIS — Z79899 Other long term (current) drug therapy: Secondary | ICD-10-CM

## 2013-03-12 DIAGNOSIS — E785 Hyperlipidemia, unspecified: Secondary | ICD-10-CM

## 2013-03-12 DIAGNOSIS — Z125 Encounter for screening for malignant neoplasm of prostate: Secondary | ICD-10-CM

## 2013-03-12 LAB — BASIC METABOLIC PANEL
CO2: 26 mEq/L (ref 19–32)
Calcium: 9.6 mg/dL (ref 8.4–10.5)
Chloride: 106 mEq/L (ref 96–112)
Creatinine, Ser: 0.9 mg/dL (ref 0.4–1.5)
Potassium: 4.7 mEq/L (ref 3.5–5.1)
Sodium: 139 mEq/L (ref 135–145)

## 2013-03-12 LAB — LIPID PANEL
Cholesterol: 199 mg/dL (ref 0–200)
LDL Cholesterol: 133 mg/dL — ABNORMAL HIGH (ref 0–99)
Total CHOL/HDL Ratio: 5
Triglycerides: 115 mg/dL (ref 0.0–149.0)

## 2013-03-12 LAB — CBC WITH DIFFERENTIAL/PLATELET
Basophils Absolute: 0 10*3/uL (ref 0.0–0.1)
Eosinophils Absolute: 0.1 10*3/uL (ref 0.0–0.7)
Eosinophils Relative: 1.4 % (ref 0.0–5.0)
HCT: 46.5 % (ref 39.0–52.0)
Lymphocytes Relative: 35.2 % (ref 12.0–46.0)
Lymphs Abs: 2 10*3/uL (ref 0.7–4.0)
MCHC: 34.7 g/dL (ref 30.0–36.0)
Monocytes Relative: 7 % (ref 3.0–12.0)
Neutro Abs: 3.2 10*3/uL (ref 1.4–7.7)
Neutrophils Relative %: 55.8 % (ref 43.0–77.0)
Platelets: 165 10*3/uL (ref 150.0–400.0)
RDW: 13.7 % (ref 11.5–14.6)

## 2013-03-12 LAB — PSA: PSA: 4.07 ng/mL — ABNORMAL HIGH (ref 0.10–4.00)

## 2013-03-12 LAB — HEPATIC FUNCTION PANEL
ALT: 27 U/L (ref 0–53)
AST: 20 U/L (ref 0–37)
Albumin: 4.2 g/dL (ref 3.5–5.2)
Alkaline Phosphatase: 65 U/L (ref 39–117)
Bilirubin, Direct: 0.1 mg/dL (ref 0.0–0.3)
Total Bilirubin: 0.9 mg/dL (ref 0.3–1.2)
Total Protein: 6.9 g/dL (ref 6.0–8.3)

## 2013-03-19 ENCOUNTER — Encounter: Payer: Self-pay | Admitting: Family Medicine

## 2013-03-19 ENCOUNTER — Ambulatory Visit (INDEPENDENT_AMBULATORY_CARE_PROVIDER_SITE_OTHER): Payer: Commercial Indemnity | Admitting: Family Medicine

## 2013-03-19 VITALS — BP 124/80 | HR 68 | Temp 98.4°F | Ht 66.25 in | Wt 200.8 lb

## 2013-03-19 DIAGNOSIS — Z23 Encounter for immunization: Secondary | ICD-10-CM

## 2013-03-19 DIAGNOSIS — R972 Elevated prostate specific antigen [PSA]: Secondary | ICD-10-CM

## 2013-03-19 DIAGNOSIS — R7303 Prediabetes: Secondary | ICD-10-CM

## 2013-03-19 DIAGNOSIS — Z0000 Encounter for general adult medical examination without abnormal findings: Secondary | ICD-10-CM

## 2013-03-19 DIAGNOSIS — N410 Acute prostatitis: Secondary | ICD-10-CM

## 2013-03-19 DIAGNOSIS — R7309 Other abnormal glucose: Secondary | ICD-10-CM

## 2013-03-19 MED ORDER — METOPROLOL TARTRATE 25 MG PO TABS: ORAL_TABLET | ORAL | Status: DC

## 2013-03-19 MED ORDER — SULFAMETHOXAZOLE-TMP DS 800-160 MG PO TABS: 1.0000 | ORAL_TABLET | Freq: Two times a day (BID) | ORAL | Status: DC

## 2013-03-19 MED ORDER — AMLODIPINE BESYLATE 10 MG PO TABS: ORAL_TABLET | ORAL | Status: DC

## 2013-03-19 MED ORDER — VARDENAFIL HCL 20 MG PO TABS: 20.0000 mg | ORAL_TABLET | Freq: Every day | ORAL | Status: DC | PRN

## 2013-03-19 NOTE — Progress Notes (Signed)
Date:  03/19/2013   Name:  Thomas Blair   DOB:  1965-10-04   MRN:  [id number] Gender: male Age: 47 y.o.  Primary Physician:  Hannah Beat, MD   Chief Complaint: Annual Exam   Subjective:   History of Present Illness:  Thomas Blair is a 47 y.o. pleasant patient who presents with the following:  Preventative Health Maintenance Visit:  Health Maintenance Summary Reviewed and updated, unless pt declines services.  Tobacco History Reviewed. Alcohol: No concerns, no excessive use Exercise Habits: Some activity, rec at least 30 mins 5 times a week STD concerns: no risk or activity to increase risk Drug Use: None Encouraged self-testicular check  Additionally, his PSA is elevated again > 4. 2 years ago he had prostatitis, and the levels went down from 15 to 2 after antibiotics and time. Biopsy was negative.   Health Maintenance  Topic Date Due  . Tetanus/tdap  04/08/1983  . Influenza Vaccine  12/08/2013    Patient Active Problem List   Diagnosis Date Noted  . PROSTATITIS, ACUTE, CHRONIC 05/07/2009  . ORGANIC IMPOTENCE 05/07/2009  . HYPERLIPIDEMIA 02/05/2009  . INGUINAL HERNIA 02/05/2009  . HYPERTENSION 01/01/2009    Past Medical History  Diagnosis Date  . History of chicken pox   . Hypertension   . Hernia   . Hyperlipidemia     Past Surgical History  Procedure Laterality Date  . Hernia repair      History   Social History  . Marital Status: Married    Spouse Name: N/A    Number of Children: N/A  . Years of Education: N/A   Occupational History  . [employer]    Social History Main Topics  . Smoking status: Never Smoker   . Smokeless tobacco: Never Used  . Alcohol Use: Yes     Comment: weekly  . Drug Use: No  . Sexual Activity: Not on file   Other Topics Concern  . Not on file   Social History Narrative   Exercise yes    No family history on file.  Allergies  Allergen Reactions  . Penicillins     REACTION: rash     Medication list has been reviewed and updated.  Review of Systems:  General: Denies fever, chills, sweats. No significant weight loss. Eyes: Denies blurring,significant itching ENT: Denies earache, sore throat, and hoarseness. Cardiovascular: Denies chest pains, palpitations, dyspnea on exertion Respiratory: Denies cough, dyspnea at rest,wheeezing Breast: no concerns about lumps GI: Denies nausea, vomiting, diarrhea, constipation, change in bowel habits, abdominal pain, melena, hematochezia GU: Denies penile discharge, ED, urinary flow / outflow problems. No STD concerns. Musculoskeletal: Denies back pain, joint pain Derm: Denies rash, itching Neuro: Denies  paresthesias, frequent falls, frequent headaches Psych: Denies depression, anxiety Endocrine: Denies cold intolerance, heat intolerance, polydipsia Heme: Denies enlarged lymph nodes Allergy: No hayfever  Objective:   Physical Examination: BP 124/80  Pulse 68  Temp(Src) 98.4 F (36.9 C) (Oral)  Ht 5' 6.25" (1.683 m)  Wt 200 lb 12 oz (91.06 kg)  BMI 32.15 kg/m2 Ideal Body Weight: Weight in (lb) to have BMI = 25: 155.7   Wt Readings from Last 3 Encounters:  03/19/13 200 lb 12 oz (91.06 kg)  03/06/12 198 lb (89.812 kg)  08/13/11 201 lb (91.173 kg)    GEN: well developed, well nourished, no acute distress Eyes: conjunctiva and lids normal, PERRLA, EOMI ENT: TM clear, nares clear, oral exam WNL Neck: supple, no lymphadenopathy, no thyromegaly, no JVD  Pulm: clear to auscultation and percussion, respiratory effort normal CV: regular rate and rhythm, S1-S2, no murmur, rub or gallop, no bruits, peripheral pulses normal and symmetric, no cyanosis, clubbing, edema or varicosities Chest: no scars, masses GI: soft, non-tender; no hepatosplenomegaly, masses; active bowel sounds all quadrants GU: no hernia, testicular mass, penile discharge, and prostate is without nodules but enlarged. Lymph: no cervical, axillary or  inguinal adenopathy MSK: gait normal, muscle tone and strength WNL, no joint swelling, effusions, discoloration, crepitus  SKIN: clear, good turgor, color WNL, no rashes, lesions, or ulcerations Neuro: normal mental status, normal strength, sensation, and motion Psych: alert; oriented to person, place and time, normally interactive and not anxious or depressed in appearance.  All labs reviewed with patient.  Lipids:    Component Value Date/Time   CHOL 199 03/12/2013 0817   TRIG 115.0 03/12/2013 0817   HDL 43.20 03/12/2013 0817   LDLDIRECT 127.9 10/22/2009 0840   VLDL 23.0 03/12/2013 0817   CHOLHDL 5 03/12/2013 0817    CBC:    Component Value Date/Time   WBC 5.8 03/12/2013 0817   HGB 16.1 03/12/2013 0817   HCT 46.5 03/12/2013 0817   PLT 165.0 03/12/2013 0817   MCV 85.4 03/12/2013 0817   NEUTROABS 3.2 03/12/2013 0817   LYMPHSABS 2.0 03/12/2013 0817   MONOABS 0.4 03/12/2013 0817   EOSABS 0.1 03/12/2013 0817   BASOSABS 0.0 03/12/2013 0817    Basic Metabolic Panel:    Component Value Date/Time   NA 139 03/12/2013 0817   K 4.7 03/12/2013 0817   CL 106 03/12/2013 0817   CO2 26 03/12/2013 0817   BUN 19 03/12/2013 0817   CREATININE 0.9 03/12/2013 0817   GLUCOSE 120* 03/12/2013 0817   CALCIUM 9.6 03/12/2013 0817    Lab Results  Component Value Date   ALT 27 03/12/2013   AST 20 03/12/2013   ALKPHOS 65 03/12/2013   BILITOT 0.9 03/12/2013    No results found for this basename: TSH    Lab Results  Component Value Date   PSA 4.07* 03/12/2013   PSA 2.90 02/28/2012   PSA 15.88* 02/09/2011    Assessment & Plan:   Health Maintenance Exam: The patient's preventative maintenance and recommended screening tests for an annual wellness exam were reviewed in full today. Brought up to date unless services declined.  Counselled on the importance of diet, exercise, and its role in overall health and mortality. The patient's FH and SH was reviewed, including their home life, tobacco status, and drug and  alcohol status.   Routine general medical examination at a health care facility  Need for prophylactic vaccination and inoculation against influenza - Plan: Flu Vaccine QUAD 36+ mos PF IM (Fluarix)  Elevated PSA, less than 10 ng/ml - Plan: PSA: more likely from prostatitis. Will treat with 6 weeks of septra, then recheck a PSA.  PROSTATITIS, ACUTE, CHRONIC  Prediabetes: counselled about diet and weight loss.  Patient Instructions  Follow-up lab visit only in 6 weeks to recheck your PSA after you have finished your antibiotics.    Orders Today:  Orders Placed This Encounter  Procedures  . Flu Vaccine QUAD 36+ mos PF IM (Fluarix)  . PSA    Standing Status: Future     Number of Occurrences:      Standing Expiration Date: 03/19/2014    New medications, updates to list, dose adjustments: Meds ordered this encounter  Medications  . amLODipine (NORVASC) 10 MG tablet    Sig: TAKE 1  TABLET BY MOUTH EVERY DAY    Dispense:  30 tablet    Refill:  11  . metoprolol tartrate (LOPRESSOR) 25 MG tablet    Sig: TAKE 1 TABLET BY MOUTH TWICE DAILY    Dispense:  60 tablet    Refill:  11  . vardenafil (LEVITRA) 20 MG tablet    Sig: Take 1 tablet (20 mg total) by mouth daily as needed for erectile dysfunction.    Dispense:  8 tablet    Refill:  11  . sulfamethoxazole-trimethoprim (BACTRIM DS) 800-160 MG per tablet    Sig: Take 1 tablet by mouth 2 (two) times daily.    Dispense:  60 tablet    Refill:  1    Signed,  Yazaira Speas T. Sophie Quiles, MD, CAQ Sports Medicine  [facility] [address] [place] [address] Phone: [phone] Fax: [phone]  Updated Complete Medication List:   Medication List       This list is accurate as of: 03/19/13 11:59 PM.  Always use your most recent med list.               amLODipine 10 MG tablet  Commonly known as:  NORVASC  TAKE 1 TABLET BY MOUTH EVERY DAY     ibuprofen 200 MG tablet  Commonly known as:   ADVIL,MOTRIN  Take 200 mg by mouth every 6 (six) hours as needed.     metoprolol tartrate 25 MG tablet  Commonly known as:  LOPRESSOR  TAKE 1 TABLET BY MOUTH TWICE DAILY     multivitamin tablet  Take 1 tablet by mouth daily.     sulfamethoxazole-trimethoprim 800-160 MG per tablet  Commonly known as:  BACTRIM DS  Take 1 tablet by mouth 2 (two) times daily.     vardenafil 20 MG tablet  Commonly known as:  LEVITRA  Take 1 tablet (20 mg total) by mouth daily as needed for erectile dysfunction.

## 2013-03-19 NOTE — Progress Notes (Signed)
Pre-visit discussion using our clinic review tool. No additional management support is needed unless otherwise documented below in the visit note.  

## 2013-03-19 NOTE — Patient Instructions (Signed)
Follow-up lab visit only in 6 weeks to recheck your PSA after you have finished your antibiotics.

## 2013-03-20 ENCOUNTER — Encounter: Payer: Self-pay | Admitting: Family Medicine

## 2013-03-20 DIAGNOSIS — R7303 Prediabetes: Secondary | ICD-10-CM | POA: Insufficient documentation

## 2013-03-20 HISTORY — DX: Prediabetes: R73.03

## 2013-04-23 ENCOUNTER — Other Ambulatory Visit (INDEPENDENT_AMBULATORY_CARE_PROVIDER_SITE_OTHER): Payer: Commercial Indemnity

## 2013-04-23 DIAGNOSIS — R972 Elevated prostate specific antigen [PSA]: Secondary | ICD-10-CM

## 2013-04-23 LAB — PSA: PSA: 3.88 ng/mL (ref 0.10–4.00)

## 2013-04-30 ENCOUNTER — Ambulatory Visit (INDEPENDENT_AMBULATORY_CARE_PROVIDER_SITE_OTHER): Payer: Commercial Indemnity | Admitting: Family Medicine

## 2013-04-30 ENCOUNTER — Encounter: Payer: Self-pay | Admitting: Family Medicine

## 2013-04-30 VITALS — BP 112/80 | HR 71 | Temp 97.5°F | Ht 66.25 in | Wt 203.8 lb

## 2013-04-30 DIAGNOSIS — E669 Obesity, unspecified: Secondary | ICD-10-CM | POA: Insufficient documentation

## 2013-04-30 DIAGNOSIS — R972 Elevated prostate specific antigen [PSA]: Secondary | ICD-10-CM

## 2013-04-30 NOTE — Progress Notes (Signed)
Pre-visit discussion using our clinic review tool. No additional management support is needed unless otherwise documented below in the visit note.  

## 2013-05-04 NOTE — Progress Notes (Signed)
>  15 minutes spent in face to face time with patient, >50% spent in counselling or coordination of care: He is here in followup about his recent PSA which was elevated. He has a lot of worries and was worried by it and he did have a significant case of prostatitis a couple of years ago with an elevated PSA greater than 10. I treated him again for presumptive prostatitis, and his PSA lower to get. I tried to answer all of his questions.  Lab Results  Component Value Date   PSA 3.88 04/23/2013   PSA 4.07* 03/12/2013   PSA 2.90 02/28/2012     Signed,  Karleen [facility] T. Floyd Wade, MD, CAQ Sports Medicine  [facility] [address] [place] [address] Phone: [phone] Fax: [phone]

## 2014-03-27 ENCOUNTER — Telehealth: Payer: Self-pay | Admitting: Family Medicine

## 2014-03-27 MED ORDER — VARDENAFIL HCL 20 MG PO TABS: 20.0000 mg | ORAL_TABLET | Freq: Every day | ORAL | Status: DC | PRN

## 2014-03-27 MED ORDER — AMLODIPINE BESYLATE 10 MG PO TABS: ORAL_TABLET | ORAL | Status: DC

## 2014-03-27 MED ORDER — METOPROLOL TARTRATE 25 MG PO TABS: ORAL_TABLET | ORAL | Status: DC

## 2014-03-27 NOTE — Telephone Encounter (Signed)
Pt called and scheduled CPE for 07/10/2014, but will need refills on 3 medications listed below.  Taking last pills this week.  Pharmacy of choice is [facility] [place].  Best number to call pt [phone].  amLODipine (NORVASC) 10 MG tablet [[id number]] metoprolol tartrate (LOPRESSOR) 25 MG tablet [[id number] vardenafil (LEVITRA) 20 MG tablet [[id number]

## 2014-03-27 NOTE — Telephone Encounter (Signed)
Ok to refill all until 08/2014

## 2014-03-29 ENCOUNTER — Other Ambulatory Visit: Payer: Self-pay | Admitting: Family Medicine

## 2014-07-10 ENCOUNTER — Ambulatory Visit (INDEPENDENT_AMBULATORY_CARE_PROVIDER_SITE_OTHER): Payer: Managed Care, Other (non HMO) | Admitting: Family Medicine

## 2014-07-10 ENCOUNTER — Encounter: Payer: Self-pay | Admitting: Family Medicine

## 2014-07-10 VITALS — BP 114/82 | HR 63 | Temp 98.4°F | Ht 66.5 in | Wt 200.5 lb

## 2014-07-10 DIAGNOSIS — Z0000 Encounter for general adult medical examination without abnormal findings: Secondary | ICD-10-CM

## 2014-07-10 DIAGNOSIS — Z79899 Other long term (current) drug therapy: Secondary | ICD-10-CM

## 2014-07-10 DIAGNOSIS — Z13220 Encounter for screening for lipoid disorders: Secondary | ICD-10-CM

## 2014-07-10 DIAGNOSIS — Z125 Encounter for screening for malignant neoplasm of prostate: Secondary | ICD-10-CM

## 2014-07-10 DIAGNOSIS — Z23 Encounter for immunization: Secondary | ICD-10-CM

## 2014-07-10 DIAGNOSIS — Z131 Encounter for screening for diabetes mellitus: Secondary | ICD-10-CM

## 2014-07-10 LAB — HEPATIC FUNCTION PANEL
ALBUMIN: 4.6 g/dL (ref 3.5–5.2)
ALK PHOS: 77 U/L (ref 39–117)
ALT: 23 U/L (ref 0–53)
AST: 15 U/L (ref 0–37)
BILIRUBIN DIRECT: 0.2 mg/dL (ref 0.0–0.3)
Total Bilirubin: 1.1 mg/dL (ref 0.2–1.2)
Total Protein: 7.5 g/dL (ref 6.0–8.3)

## 2014-07-10 LAB — BASIC METABOLIC PANEL
BUN: 23 mg/dL (ref 6–23)
CALCIUM: 10.2 mg/dL (ref 8.4–10.5)
CO2: 30 meq/L (ref 19–32)
Chloride: 104 mEq/L (ref 96–112)
Creatinine, Ser: 0.88 mg/dL (ref 0.40–1.50)
GFR: 97.33 mL/min (ref >60.00)
Glucose, Bld: 113 mg/dL — ABNORMAL HIGH (ref 70–99)
Potassium: 4.7 mEq/L (ref 3.5–5.1)
Sodium: 139 mEq/L (ref 135–145)

## 2014-07-10 LAB — LIPID PANEL
CHOLESTEROL: 191 mg/dL (ref 0–200)
HDL: 44.2 mg/dL (ref >39.00)
LDL Cholesterol: 135 mg/dL — ABNORMAL HIGH (ref 0–99)
NonHDL: 146.8
TRIGLYCERIDES: 58 mg/dL (ref 0.0–149.0)
Total CHOL/HDL Ratio: 4
VLDL: 11.6 mg/dL (ref 0.0–40.0)

## 2014-07-10 LAB — CBC WITH DIFFERENTIAL/PLATELET
BASOS PCT: 0.5 % (ref 0.0–3.0)
Basophils Absolute: 0 10*3/uL (ref 0.0–0.1)
EOS PCT: 0.9 % (ref 0.0–5.0)
Eosinophils Absolute: 0.1 10*3/uL (ref 0.0–0.7)
HEMATOCRIT: 45.8 % (ref 39.0–52.0)
HEMOGLOBIN: 15.6 g/dL (ref 13.0–17.0)
Lymphocytes Relative: 28.3 % (ref 12.0–46.0)
Lymphs Abs: 2.4 10*3/uL (ref 0.7–4.0)
MCHC: 34.2 g/dL (ref 30.0–36.0)
MCV: 84.1 fl (ref 78.0–100.0)
MONO ABS: 0.5 10*3/uL (ref 0.1–1.0)
Monocytes Relative: 6.2 % (ref 3.0–12.0)
Neutro Abs: 5.3 10*3/uL (ref 1.4–7.7)
Neutrophils Relative %: 64.1 % (ref 43.0–77.0)
Platelets: 244 10*3/uL (ref 150.0–400.0)
RBC: 5.44 Mil/uL (ref 4.22–5.81)
RDW: 13.7 % (ref 11.5–15.5)
WBC: 8.3 10*3/uL (ref 4.0–10.5)

## 2014-07-10 LAB — PSA: PSA: 14.93 ng/mL — ABNORMAL HIGH (ref 0.10–4.00)

## 2014-07-10 MED ORDER — TADALAFIL 20 MG PO TABS: 10.0000 mg | ORAL_TABLET | ORAL | Status: DC | PRN

## 2014-07-10 NOTE — Progress Notes (Signed)
Dr. Frederico Hamman T. Jakiera Ehler, MD, [facility] [place], [address] Phone: [phone] Fax: [phone]  07/10/2014  Patient: Thomas Blair, MRN: [id number], DOB: Aug 12, 1965, 49 y.o.  Primary Physician:  Owens Loffler, MD  Chief Complaint: Annual Exam  Subjective:   Thomas Blair is a 49 y.o. pleasant patient who presents with the following:  Preventative Health Maintenance Visit:  Health Maintenance Summary Reviewed and updated, unless pt declines services.  Tobacco History Reviewed. Alcohol: No concerns, no excessive use Exercise Habits: Some activity, rec at least 30 mins 5 times a week STD concerns: no risk or activity to increase risk Drug Use: None Encouraged self-testicular check  Has had some hemorrhoids, then thought that it went away. Used some cream and tuck's wipes. Does not hurt now. Then went away.   Health Maintenance  Topic Date Due  . HIV Screening  04/08/1979  . COLONOSCOPY  04/07/2014  . INFLUENZA VACCINE  12/09/2014  . TETANUS/TDAP  07/09/2024   Immunization History  Administered Date(s) Administered  . Influenza Split 02/15/2011, 03/06/2012  . Influenza Whole 02/05/2009  . Influenza, Seasonal, Injecte, Preservative Fre 03/11/2014  . Influenza,inj,Quad PF,36+ Mos 03/19/2013  . Tdap 07/10/2014   Patient Active Problem List   Diagnosis Date Noted  . Obesity (BMI 30-39.9) 04/30/2013  . Prediabetes 03/20/2013  . PROSTATITIS, ACUTE, CHRONIC 05/07/2009  . ORGANIC IMPOTENCE 05/07/2009  . HYPERLIPIDEMIA 02/05/2009  . INGUINAL HERNIA 02/05/2009  . HYPERTENSION 01/01/2009   Past Medical History  Diagnosis Date  . History of chicken pox   . Hypertension   . Hernia   . Hyperlipidemia   . Prediabetes 03/20/2013   Past Surgical History  Procedure Laterality Date  . Hernia repair     History   Social History  . Marital Status: Married    Spouse Name: N/A  . Number of Children:  N/A  . Years of Education: N/A   Occupational History  . [employer]    Social History Main Topics  . Smoking status: Never Smoker   . Smokeless tobacco: Never Used  . Alcohol Use: Yes     Comment: weekly  . Drug Use: No  . Sexual Activity: Not on file   Other Topics Concern  . Not on file   Social History Narrative   Exercise yes   No family history on file. Allergies  Allergen Reactions  . Penicillins     REACTION: rash    Medication list has been reviewed and updated.   General: Denies fever, chills, sweats. No significant weight loss. Eyes: Denies blurring,significant itching ENT: Denies earache, sore throat, and hoarseness. Cardiovascular: Denies chest pains, palpitations, dyspnea on exertion Respiratory: Denies cough, dyspnea at rest,wheeezing Breast: no concerns about lumps GI: HEMORRHOIDS GU: Denies penile discharge, some ED, no urinary flow / outflow problems. No STD concerns. Musculoskeletal: Denies back pain, joint pain Derm: Denies rash, itching Neuro: Denies  paresthesias, frequent falls, frequent headaches Psych: Denies depression, anxiety Endocrine: Denies cold intolerance, heat intolerance, polydipsia Heme: Denies enlarged lymph nodes Allergy: No hayfever  Objective:   BP 114/82 mmHg  Pulse 63  Temp(Src) 98.4 F (36.9 C) (Oral)  Ht 5' 6.5" (1.689 m)  Wt 200 lb 8 oz (90.946 kg)  BMI 31.88 kg/m2 Ideal Body Weight: Weight in (lb) to have BMI = 25: 156.9  No exam data present  GEN: well developed, well nourished, no acute distress Eyes: conjunctiva and lids normal, PERRLA, EOMI ENT:  TM clear, nares clear, oral exam WNL Neck: supple, no lymphadenopathy, no thyromegaly, no JVD Pulm: clear to auscultation and percussion, respiratory effort normal CV: regular rate and rhythm, S1-S2, no murmur, rub or gallop, no bruits, peripheral pulses normal and symmetric, no cyanosis, clubbing, edema or varicosities GI: soft, non-tender; no  hepatosplenomegaly, masses; active bowel sounds all quadrants GU: no hernia, testicular mass, penile discharge Lymph: no cervical, axillary or inguinal adenopathy MSK: gait normal, muscle tone and strength WNL, no joint swelling, effusions, discoloration, crepitus  SKIN: clear, good turgor, color WNL, no rashes, lesions, or ulcerations Neuro: normal mental status, normal strength, sensation, and motion Psych: alert; oriented to person, place and time, normally interactive and not anxious or depressed in appearance. All labs reviewed with patient.  Lipids:    Component Value Date/Time   CHOL 199 03/12/2013 0817   TRIG 115.0 03/12/2013 0817   HDL 43.20 03/12/2013 0817   LDLDIRECT 127.9 10/22/2009 0840   VLDL 23.0 03/12/2013 0817   CHOLHDL 5 03/12/2013 0817   CBC: CBC Latest Ref Rng 03/12/2013 02/28/2012 02/09/2011  WBC 4.5 - 10.5 K/uL 5.8 4.6 5.5  Hemoglobin 13.0 - 17.0 g/dL 16.1 15.3 15.3  Hematocrit 39.0 - 52.0 % 46.5 46.1 45.6  Platelets 150.0 - 400.0 K/uL 165.0 161.0 161.0    Basic Metabolic Panel:    Component Value Date/Time   NA 139 03/12/2013 0817   K 4.7 03/12/2013 0817   CL 106 03/12/2013 0817   CO2 26 03/12/2013 0817   BUN 19 03/12/2013 0817   CREATININE 0.9 03/12/2013 0817   GLUCOSE 120* 03/12/2013 0817   CALCIUM 9.6 03/12/2013 0817   Hepatic Function Latest Ref Rng 03/12/2013 02/28/2012 02/09/2011  Total Protein 6.0 - 8.3 g/dL 6.9 6.8 6.8  Albumin 3.5 - 5.2 g/dL 4.2 4.0 4.2  AST 0 - 37 U/L _0 ALT 0 - 53 U/L _1 Alk Phosphatase 39 - 117 U/L 65 59 68  Total Bilirubin 0.3 - 1.2 mg/dL 0.9 0.9 1.0  Bilirubin, Direct 0.0 - 0.3 mg/dL 0.1 0.2 0.1    No results found for: TSH Lab Results  Component Value Date   PSA 3.88 04/23/2013   PSA 4.07* 03/12/2013   PSA 2.90 02/28/2012    Assessment and Plan:   Routine general medical examination at a health care facility  Need for prophylactic vaccination with combined diphtheria-tetanus-pertussis (DTP)  vaccine - Plan: Tdap vaccine greater than or equal to 7yo IM  Screening, lipid - Plan: Lipid panel  Screening for diabetes mellitus - Plan: CBC with Differential/Platelet  Screening for prostate cancer - Plan: PSA  Encounter for long-term (current) use of medications - Plan: Basic metabolic panel, Hepatic function panel  Health Maintenance Exam: The patient's preventative maintenance and recommended screening tests for an annual wellness exam were reviewed in full today. Brought up to date unless services declined.  Counselled on the importance of diet, exercise, and its role in overall health and mortality. The patient's FH and SH was reviewed, including their home life, tobacco status, and drug and alcohol status.  Rec colonoscopy - he will call  Follow-up: No Follow-up on file. Unless noted, follow-up in 1 year for Health Maintenance Exam.  New Prescriptions   TADALAFIL (CIALIS) 20 MG TABLET    Take 0.5-1 tablets (10-20 mg total) by mouth every other day as needed for erectile dysfunction.   Orders Placed This Encounter  Procedures  . Tdap vaccine greater than or equal to 7yo  IM  . Basic metabolic panel  . CBC with Differential/Platelet  . Hepatic function panel  . Lipid panel  . PSA    Signed,  Frederico Hamman T. Dewell Monnier, MD   Patient's Medications  New Prescriptions   TADALAFIL (CIALIS) 20 MG TABLET    Take 0.5-1 tablets (10-20 mg total) by mouth every other day as needed for erectile dysfunction.  Previous Medications   AMLODIPINE (NORVASC) 10 MG TABLET    TAKE 1 TABLET BY MOUTH EVERY DAY   IBUPROFEN (ADVIL,MOTRIN) 200 MG TABLET    Take 200 mg by mouth every 6 (six) hours as needed.   METOPROLOL TARTRATE (LOPRESSOR) 25 MG TABLET    TAKE 1 TABLET BY MOUTH TWICE DAILY   MULTIPLE VITAMIN (MULTIVITAMIN) TABLET    Take 1 tablet by mouth daily. Alive Men's Energy  Modified Medications   No medications on file  Discontinued Medications   SULFAMETHOXAZOLE-TRIMETHOPRIM (BACTRIM  DS,SEPTRA DS) 800-160 MG PER TABLET    Take 1 tablet by mouth 2 (two) times daily.   VARDENAFIL (LEVITRA) 20 MG TABLET    Take 1 tablet (20 mg total) by mouth daily as needed for erectile dysfunction.

## 2014-07-10 NOTE — Addendum Note (Signed)
Addended by: Marchia Bond on: 07/10/2014 03:45 PM   Modules accepted: Miquel Dunn

## 2014-07-10 NOTE — Progress Notes (Signed)
Pre visit review using our clinic review tool, if applicable. No additional management support is needed unless otherwise documented below in the visit note. 

## 2014-07-16 ENCOUNTER — Telehealth: Payer: Self-pay | Admitting: *Deleted

## 2014-07-16 MED ORDER — CIPROFLOXACIN HCL 500 MG PO TABS: 500.0000 mg | ORAL_TABLET | Freq: Two times a day (BID) | ORAL | Status: DC

## 2014-07-16 NOTE — Telephone Encounter (Signed)
-----   Message from Owens Loffler, MD sent at 07/16/2014  4:14 PM EST ----- LMOM.  Please call him:  PSA is 15. He has had before. My hope is that it is prostatitis again. We should treat that for a month and recheck his PSA numbers.   Blood sugar is stable. 113 (still prediabetic) Everything else is fine and stable including cholesterol.  Cipro 500 mg, 1 po bid for 30 days, #60, 0 ref  PSA blood draw only after that.

## 2014-07-16 NOTE — Telephone Encounter (Signed)
Left message for Thomas Blair to return my call.

## 2014-07-17 MED ORDER — VARDENAFIL HCL 20 MG PO TABS: 20.0000 mg | ORAL_TABLET | Freq: Every day | ORAL | Status: DC | PRN

## 2014-07-17 NOTE — Telephone Encounter (Signed)
Froylan notified as instructed by telephone.  Cipro prescripton sent in to [facility] in [place].  While on the phone patient states that his insurance will not cover the Cialis and would like to switch back to the Levitra.  Prescription sent in to [facility] as requested.  Medication list updated.

## 2014-07-17 NOTE — Telephone Encounter (Signed)
agree

## 2014-08-16 ENCOUNTER — Telehealth: Payer: Self-pay | Admitting: *Deleted

## 2014-08-16 DIAGNOSIS — R972 Elevated prostate specific antigen [PSA]: Secondary | ICD-10-CM

## 2014-08-16 NOTE — Telephone Encounter (Signed)
Pt scheduled appt and ordered test

## 2014-08-16 NOTE — Telephone Encounter (Signed)
Anytime next week should be fine.   Terri, can you help arrange blood draw for him at his convenience?  PSA, Total and Free: elevated PSA

## 2014-08-16 NOTE — Telephone Encounter (Signed)
Mr. Thomas Blair states that he has one more day of antibiotics to complete for prostatitis and wants to know at what point does he need to have his PSA redrawn.  Please advise.

## 2014-08-21 ENCOUNTER — Other Ambulatory Visit (INDEPENDENT_AMBULATORY_CARE_PROVIDER_SITE_OTHER): Payer: Managed Care, Other (non HMO)

## 2014-08-21 DIAGNOSIS — R972 Elevated prostate specific antigen [PSA]: Secondary | ICD-10-CM

## 2014-08-22 LAB — PSA, TOTAL AND FREE
PSA FREE PCT: 15 % — AB (ref >25)
PSA FREE: 1.14 ng/mL
PSA: 7.48 ng/mL — AB (ref <=4.00)

## 2014-08-27 ENCOUNTER — Other Ambulatory Visit: Payer: Self-pay | Admitting: Family Medicine

## 2014-08-27 DIAGNOSIS — R972 Elevated prostate specific antigen [PSA]: Secondary | ICD-10-CM

## 2014-09-12 ENCOUNTER — Other Ambulatory Visit: Payer: Self-pay | Admitting: Family Medicine

## 2014-10-28 ENCOUNTER — Encounter: Payer: Self-pay | Admitting: Urology

## 2014-10-28 ENCOUNTER — Ambulatory Visit: Payer: Self-pay | Admitting: Urology

## 2015-02-19 ENCOUNTER — Other Ambulatory Visit: Payer: Self-pay | Admitting: Family Medicine

## 2015-07-09 ENCOUNTER — Other Ambulatory Visit (INDEPENDENT_AMBULATORY_CARE_PROVIDER_SITE_OTHER): Payer: Managed Care, Other (non HMO)

## 2015-07-09 DIAGNOSIS — Z13220 Encounter for screening for lipoid disorders: Secondary | ICD-10-CM | POA: Diagnosis not present

## 2015-07-09 DIAGNOSIS — Z125 Encounter for screening for malignant neoplasm of prostate: Secondary | ICD-10-CM

## 2015-07-09 DIAGNOSIS — Z79899 Other long term (current) drug therapy: Secondary | ICD-10-CM | POA: Diagnosis not present

## 2015-07-09 LAB — HEPATIC FUNCTION PANEL
ALK PHOS: 64 U/L (ref 39–117)
ALT: 17 U/L (ref 0–53)
AST: 15 U/L (ref 0–37)
Albumin: 4.3 g/dL (ref 3.5–5.2)
BILIRUBIN DIRECT: 0.1 mg/dL (ref 0.0–0.3)
BILIRUBIN TOTAL: 0.7 mg/dL (ref 0.2–1.2)
TOTAL PROTEIN: 7 g/dL (ref 6.0–8.3)

## 2015-07-09 LAB — BASIC METABOLIC PANEL
BUN: 22 mg/dL (ref 6–23)
CHLORIDE: 105 meq/L (ref 96–112)
CO2: 27 meq/L (ref 19–32)
CREATININE: 0.82 mg/dL (ref 0.40–1.50)
Calcium: 9.4 mg/dL (ref 8.4–10.5)
GFR: 105.17 mL/min (ref >60.00)
Glucose, Bld: 120 mg/dL — ABNORMAL HIGH (ref 70–99)
POTASSIUM: 4.3 meq/L (ref 3.5–5.1)
SODIUM: 139 meq/L (ref 135–145)

## 2015-07-09 LAB — CBC WITH DIFFERENTIAL/PLATELET
BASOS ABS: 0 10*3/uL (ref 0.0–0.1)
Basophils Relative: 0.6 % (ref 0.0–3.0)
EOS ABS: 0.1 10*3/uL (ref 0.0–0.7)
Eosinophils Relative: 1.8 % (ref 0.0–5.0)
HCT: 44.5 % (ref 39.0–52.0)
Hemoglobin: 15.3 g/dL (ref 13.0–17.0)
LYMPHS ABS: 1.7 10*3/uL (ref 0.7–4.0)
Lymphocytes Relative: 35.6 % (ref 12.0–46.0)
MCHC: 34.4 g/dL (ref 30.0–36.0)
MCV: 85.1 fl (ref 78.0–100.0)
MONO ABS: 0.3 10*3/uL (ref 0.1–1.0)
MONOS PCT: 7.1 % (ref 3.0–12.0)
NEUTROS ABS: 2.6 10*3/uL (ref 1.4–7.7)
NEUTROS PCT: 54.9 % (ref 43.0–77.0)
PLATELETS: 168 10*3/uL (ref 150.0–400.0)
RBC: 5.23 Mil/uL (ref 4.22–5.81)
RDW: 13.8 % (ref 11.5–15.5)
WBC: 4.7 10*3/uL (ref 4.0–10.5)

## 2015-07-09 LAB — LIPID PANEL
CHOL/HDL RATIO: 4
Cholesterol: 173 mg/dL (ref 0–200)
HDL: 43.7 mg/dL (ref >39.00)
LDL CALC: 119 mg/dL — AB (ref 0–99)
NONHDL: 129.67
TRIGLYCERIDES: 54 mg/dL (ref 0.0–149.0)
VLDL: 10.8 mg/dL (ref 0.0–40.0)

## 2015-07-10 LAB — PSA, TOTAL AND FREE
PSA FREE PCT: 23 % — AB (ref >25)
PSA FREE: 0.98 ng/mL
PSA: 4.34 ng/mL — ABNORMAL HIGH (ref <=4.00)

## 2015-07-14 ENCOUNTER — Encounter: Payer: Self-pay | Admitting: Family Medicine

## 2015-07-14 ENCOUNTER — Ambulatory Visit (INDEPENDENT_AMBULATORY_CARE_PROVIDER_SITE_OTHER): Payer: Managed Care, Other (non HMO) | Admitting: Family Medicine

## 2015-07-14 VITALS — BP 122/80 | HR 71 | Temp 98.6°F | Ht 66.25 in | Wt 201.5 lb

## 2015-07-14 DIAGNOSIS — N4889 Other specified disorders of penis: Secondary | ICD-10-CM

## 2015-07-14 DIAGNOSIS — Z1211 Encounter for screening for malignant neoplasm of colon: Secondary | ICD-10-CM

## 2015-07-14 DIAGNOSIS — Z0001 Encounter for general adult medical examination with abnormal findings: Secondary | ICD-10-CM

## 2015-07-14 DIAGNOSIS — N489 Disorder of penis, unspecified: Secondary | ICD-10-CM

## 2015-07-14 DIAGNOSIS — L989 Disorder of the skin and subcutaneous tissue, unspecified: Secondary | ICD-10-CM

## 2015-07-14 DIAGNOSIS — Z0000 Encounter for general adult medical examination without abnormal findings: Secondary | ICD-10-CM

## 2015-07-14 DIAGNOSIS — R972 Elevated prostate specific antigen [PSA]: Secondary | ICD-10-CM | POA: Diagnosis not present

## 2015-07-14 MED ORDER — SILDENAFIL CITRATE 20 MG PO TABS: ORAL_TABLET | ORAL | Status: DC

## 2015-07-14 NOTE — Progress Notes (Signed)
Pre visit review using our clinic review tool, if applicable. No additional management support is needed unless otherwise documented below in the visit note. 

## 2015-07-14 NOTE — Progress Notes (Signed)
Dr. Frederico Hamman T. Aubreyanna Dorrough, MD, [facility] [place], [address] Phone: [phone] Fax: [phone]  07/14/2015  Patient: Thomas Blair, MRN: [id number], DOB: [birth year], 50 y.o.  Primary Physician:  Owens Loffler, MD   Chief Complaint  Patient presents with  . Annual Exam   Subjective:   Little RAYMOUND KATICH is a 50 y.o. pleasant patient who presents with the following:  Preventative Health Maintenance Visit:  Health Maintenance Summary Reviewed and updated, unless pt declines services.  Tobacco History Reviewed. Alcohol: No concerns, no excessive use Exercise Habits: Some activity, rec at least 30 mins 5 times a week STD concerns: as belowDrug Use: None Encouraged self-testicular check  Red place on his penis on th ehead end. He has been with his wife for many years, but both of them do have oral herpes that are known, and he has never had an outbreak of genital herpes in the past.  This did hurt a little bit at the beginning and it is not associated with any kind of gripping or discharge and is isolated lesion on the tip of his penis.  He noticed it more after intercourse.  Urologist - went to the urologist before, and at that point they recommended a repeat prostate biopsy, but the patient declined that recommendation.  He was supposed to have followed up in 6 months, but he never did that.  His PSA is elevated again, notably for age and race, and he wanted my recommendations.   Health Maintenance  Topic Date Due  . Hepatitis C Screening  January 22, 1966  . HIV Screening  04/08/1979  . COLONOSCOPY  04/07/2014  . INFLUENZA VACCINE  12/09/2014  . TETANUS/TDAP  07/09/2024   Immunization History  Administered Date(s) Administered  . Influenza Split 02/15/2011, 03/06/2012  . Influenza Whole 02/05/2009  . Influenza, Seasonal, Injecte, Preservative Fre 03/11/2014  . Influenza,inj,Quad PF,36+ Mos 03/19/2013  . Tdap  07/10/2014   Patient Active Problem List   Diagnosis Date Noted  . Obesity (BMI 30-39.9) 04/30/2013  . Prediabetes 03/20/2013  . PROSTATITIS, ACUTE, CHRONIC 05/07/2009  . ORGANIC IMPOTENCE 05/07/2009  . HYPERLIPIDEMIA 02/05/2009  . HYPERTENSION 01/01/2009   Past Medical History  Diagnosis Date  . History of chicken pox   . Hypertension   . Hernia   . Hyperlipidemia   . Prediabetes 03/20/2013   Past Surgical History  Procedure Laterality Date  . Hernia repair     Social History   Social History  . Marital Status: Married    Spouse Name: N/A  . Number of Children: N/A  . Years of Education: N/A   Occupational History  . [employer]    Social History Main Topics  . Smoking status: Never Smoker   . Smokeless tobacco: Never Used  . Alcohol Use: Yes     Comment: weekly  . Drug Use: No  . Sexual Activity: Not on file   Other Topics Concern  . Not on file   Social History Narrative   Exercise yes   No family history on file. Allergies  Allergen Reactions  . Penicillins     REACTION: rash    Medication list has been reviewed and updated.   General: Denies fever, chills, sweats. No significant weight loss. Eyes: Denies blurring,significant itching ENT: Denies earache, sore throat, and hoarseness. Cardiovascular: Denies chest pains, palpitations, dyspnea on exertion Respiratory: Denies cough, dyspnea at rest,wheeezing Breast: no concerns about lumps GI: Denies nausea, vomiting,  diarrhea, constipation, change in bowel habits, abdominal pain, melena, hematochezia GU: As above, with cont ED and penile lesion Musculoskeletal: Denies back pain, joint pain Derm: Denies rash, itching Neuro: Denies  paresthesias, frequent falls, frequent headaches Psych: Denies depression, anxiety Endocrine: Denies cold intolerance, heat intolerance, polydipsia Heme: Denies enlarged lymph nodes Allergy: No hayfever  Objective:   BP 122/80 mmHg  Pulse 71  Temp(Src) 98.6 F (37  C) (Oral)  Ht 5' 6.25" (1.683 m)  Wt 201 lb 8 oz (91.4 kg)  BMI 32.27 kg/m2 Ideal Body Weight: Weight in (lb) to have BMI = 25: 155.7  No exam data present  GEN: well developed, well nourished, no acute distress Eyes: conjunctiva and lids normal, PERRLA, EOMI ENT: TM clear, nares clear, oral exam WNL Neck: supple, no lymphadenopathy, no thyromegaly, no JVD Pulm: clear to auscultation and percussion, respiratory effort normal CV: regular rate and rhythm, S1-S2, no murmur, rub or gallop, no bruits, peripheral pulses normal and symmetric, no cyanosis, clubbing, edema or varicosities GI: soft, non-tender; no hepatosplenomegaly, masses; active bowel sounds all quadrants GU: 2 mm small ulcerated lesion at penile glans near base of this Lymph: no cervical, axillary or inguinal adenopathy MSK: gait normal, muscle tone and strength WNL, no joint swelling, effusions, discoloration, crepitus  SKIN: clear, good turgor, color WNL, no rashes, lesions, or ulcerations Neuro: normal mental status, normal strength, sensation, and motion Psych: alert; oriented to person, place and time, normally interactive and not anxious or depressed in appearance. All labs reviewed with patient.  Lipids:    Component Value Date/Time   CHOL 173 07/09/2015 0743   TRIG 54.0 07/09/2015 0743   HDL 43.70 07/09/2015 0743   LDLDIRECT 127.9 10/22/2009 0840   VLDL 10.8 07/09/2015 0743   CHOLHDL 4 07/09/2015 0743   CBC: CBC Latest Ref Rng 07/09/2015 07/10/2014 03/12/2013  WBC 4.0 - 10.5 K/uL 4.7 8.3 5.8  Hemoglobin 13.0 - 17.0 g/dL 15.3 15.6 16.1  Hematocrit 39.0 - 52.0 % 44.5 45.8 46.5  Platelets 150.0 - 400.0 K/uL 168.0 244.0 315.4    Basic Metabolic Panel:    Component Value Date/Time   NA 139 07/09/2015 0743   K 4.3 07/09/2015 0743   CL 105 07/09/2015 0743   CO2 27 07/09/2015 0743   BUN 22 07/09/2015 0743   CREATININE 0.82 07/09/2015 0743   GLUCOSE 120* 07/09/2015 0743   CALCIUM 9.4 07/09/2015 0743    Hepatic Function Latest Ref Rng 07/09/2015 07/10/2014 03/12/2013  Total Protein 6.0 - 8.3 g/dL 7.0 7.5 6.9  Albumin 3.5 - 5.2 g/dL 4.3 4.6 4.2  AST 0 - 37 U/L 15 15 20   ALT 0 - 53 U/L 17 23 27   Alk Phosphatase 39 - 117 U/L 64 77 65  Total Bilirubin 0.2 - 1.2 mg/dL 0.7 1.1 0.9  Bilirubin, Direct 0.0 - 0.3 mg/dL 0.1 0.2 0.1    No results found for: TSH Lab Results  Component Value Date   PSA 4.34* 07/09/2015   PSA 7.48* 08/21/2014   PSA 14.93* 07/10/2014    Assessment and Plan:   Routine general medical examination at a health care facility  Screening for colon cancer - Plan: Ambulatory referral to Gastroenterology  Penile lesion - Plan: Herpes simplex virus culture: in a patient with known, frequent HSV outbreaks with the mouth, I suspect that his PCR would be positive in any case, but we will see if his current lesion is HSV and culture this.  We reviewed this and detail that this may  have been associated with a prior infection even decades prior, but it is very reasonable to assess this, and he would like to know for his peace of mind.  He also would like to know this and he can discuss this further with his wife.  All questions have been answered.  Elevated PSA: PSA is again elevated, and in a 50 year old white male, strongly recommended follow-up with urology and to follow-up with their recommendations.  He has a urologist and will call them and make appointment shortly.  Health Maintenance Exam: The patient's preventative maintenance and recommended screening tests for an annual wellness exam were reviewed in full today. Brought up to date unless services declined.  Counselled on the importance of diet, exercise, and its role in overall health and mortality. The patient's FH and SH was reviewed, including their home life, tobacco status, and drug and alcohol status.  Follow-up: No Follow-up on file. Unless noted, follow-up in 1 year for Health Maintenance Exam.  New  Prescriptions   SILDENAFIL (REVATIO) 20 MG TABLET    Generic Revatio / Sildanefil 20 mg. 2 - 5 tabs 30 mins prior to intercourse. #50, 11 refills.   Modified Medications   No medications on file   Orders Placed This Encounter  Procedures  . Herpes simplex virus culture  . Ambulatory referral to Gastroenterology    Signed,  Frederico Hamman T. Arlon Bleier, MD   Patient's Medications  New Prescriptions   SILDENAFIL (REVATIO) 20 MG TABLET    Generic Revatio / Sildanefil 20 mg. 2 - 5 tabs 30 mins prior to intercourse. #50, 11 refills.  Previous Medications   AMLODIPINE (NORVASC) 10 MG TABLET    TAKE 1 TABLET BY MOUTH EVERY DAY   IBUPROFEN (ADVIL,MOTRIN) 200 MG TABLET    Take 200 mg by mouth every 6 (six) hours as needed.   METOPROLOL TARTRATE (LOPRESSOR) 25 MG TABLET    TAKE 1 TABLET BY MOUTH TWICE A DAY   MULTIPLE VITAMIN (MULTIVITAMIN) TABLET    Take 1 tablet by mouth daily. Alive Men's Energy  Modified Medications   No medications on file  Discontinued Medications   CIPROFLOXACIN (CIPRO) 500 MG TABLET    Take 1 tablet (500 mg total) by mouth 2 (two) times daily.   VARDENAFIL (LEVITRA) 20 MG TABLET    Take 1 tablet (20 mg total) by mouth daily as needed for erectile dysfunction.

## 2015-07-14 NOTE — Patient Instructions (Signed)

## 2015-07-16 LAB — HERPES SIMPLEX VIRUS CULTURE: ORGANISM ID, BACTERIA: NOT DETECTED

## 2015-08-16 ENCOUNTER — Other Ambulatory Visit: Payer: Self-pay | Admitting: Family Medicine

## 2015-09-01 ENCOUNTER — Encounter: Payer: Self-pay | Admitting: Gastroenterology

## 2015-10-15 ENCOUNTER — Ambulatory Visit (AMBULATORY_SURGERY_CENTER): Payer: Self-pay | Admitting: *Deleted

## 2015-10-15 VITALS — Ht 68.0 in | Wt 206.0 lb

## 2015-10-15 DIAGNOSIS — Z1211 Encounter for screening for malignant neoplasm of colon: Secondary | ICD-10-CM

## 2015-10-15 MED ORDER — NA SULFATE-K SULFATE-MG SULF 17.5-3.13-1.6 GM/177ML PO SOLN: 1.0000 | Freq: Once | ORAL | Status: DC

## 2015-10-15 NOTE — Progress Notes (Signed)
No egg or soy allergy known to patient  No issues with past sedation with any surgeries  or procedures, no intubation problems -pt states he had chest pain and gas post op hernia surgery  No diet pills per patient No home 02 use per patient  No blood thinners per patient  Pt denies issues with constipation   emmi video declined

## 2015-10-16 ENCOUNTER — Encounter: Payer: Self-pay | Admitting: Gastroenterology

## 2015-10-29 ENCOUNTER — Encounter: Payer: Self-pay | Admitting: Gastroenterology

## 2015-10-29 ENCOUNTER — Ambulatory Visit (AMBULATORY_SURGERY_CENTER): Payer: Managed Care, Other (non HMO) | Admitting: Gastroenterology

## 2015-10-29 VITALS — BP 112/73 | HR 59 | Temp 98.0°F | Resp 16 | Ht 68.0 in | Wt 206.0 lb

## 2015-10-29 DIAGNOSIS — D123 Benign neoplasm of transverse colon: Secondary | ICD-10-CM

## 2015-10-29 DIAGNOSIS — Z1211 Encounter for screening for malignant neoplasm of colon: Secondary | ICD-10-CM | POA: Diagnosis present

## 2015-10-29 MED ORDER — SODIUM CHLORIDE 0.9 % IV SOLN: 500.0000 mL | INTRAVENOUS | Status: DC

## 2015-10-29 NOTE — Op Note (Addendum)
[facility] Patient Name: Thomas Blair Procedure Date: 10/29/2015 8:21 AM MRN: [id number] Endoscopist: Milus Banister , MD Age: 50 Referring MD:  Date of Birth: 1965/12/04 Gender: Male Account #: [id number] Procedure:                Colonoscopy Indications:              Screening for colorectal malignant neoplasm Medicines:                Monitored Anesthesia Care Procedure:                Pre-Anesthesia Assessment:                           - Prior to the procedure, a History and Physical                            was performed, and patient medications and                            allergies were reviewed. The patient's tolerance of                            previous anesthesia was also reviewed. The risks                            and benefits of the procedure and the sedation                            options and risks were discussed with the patient.                            All questions were answered, and informed consent                            was obtained. Prior Anticoagulants: The patient has                            taken no previous anticoagulant or antiplatelet                            agents. ASA Grade Assessment: II - A patient with                            mild systemic disease. After reviewing the risks                            and benefits, the patient was deemed in                            satisfactory condition to undergo the procedure.                           After obtaining informed consent, the colonoscope  was passed under direct vision. Throughout the                            procedure, the patient's blood pressure, pulse, and                            oxygen saturations were monitored continuously. The                            Model CF-HQ190L [id number]) scope was introduced                            through the anus and advanced to the the cecum,                            identified by  appendiceal orifice and ileocecal                            valve. The colonoscopy was performed without                            difficulty. The patient tolerated the procedure                            well. The quality of the bowel preparation was                            good. The ileocecal valve, appendiceal orifice, and                            rectum were photographed. Scope In: 8:32:55 AM Scope Out: 8:46:51 AM Scope Withdrawal Time: 0 hours 10 minutes 34 seconds  Total Procedure Duration: 0 hours 13 minutes 56 seconds  Findings:                 A 3 mm polyp was found in the transverse colon. The                            polyp was sessile. The polyp was removed with a                            cold snare. Resection and retrieval were complete.                           The exam was otherwise without abnormality on                            direct and retroflexion views. Complications:            No immediate complications. Estimated blood loss:                            None. Estimated Blood Loss:     Estimated blood loss: none. Impression:               -  One 3 mm polyp in the transverse colon, removed                            with a cold snare. Resected and retrieved.                           - The examination was otherwise normal on direct                            and retroflexion views. Recommendation:           - Patient has a contact number available for                            emergencies. The signs and symptoms of potential                            delayed complications were discussed with the                            patient. Return to normal activities tomorrow.                            Written discharge instructions were provided to the                            patient.                           - Resume previous diet.                           - Continue present medications.                           You will receive a letter within 2-3  weeks with the                            pathology results and my final recommendations.                           If the polyp(s) is proven to be 'pre-cancerous' on                            pathology, you will need repeat colonoscopy in 5                            years. If the polyp(s) is NOT 'precancerous' on                            pathology then you should repeat colon cancer                            screening in 10 years with colonoscopy without need  for colon cancer screening by any method prior to                            then (including stool testing). Milus Banister, MD 10/29/2015 8:52:09 AM This report has been signed electronically.

## 2015-10-29 NOTE — Progress Notes (Signed)
Called to room to assist during endoscopic procedure.  Patient ID and intended procedure confirmed with present staff. Received instructions for my participation in the procedure from the performing physician.  

## 2015-10-29 NOTE — Progress Notes (Signed)
A/ox3 pleased with MAC, report to Wendy RN 

## 2015-10-29 NOTE — Patient Instructions (Signed)
YOU HAD AN ENDOSCOPIC PROCEDURE TODAY AT [facility]:   Refer to the procedure report that was given to you for any specific questions about what was found during the examination.  If the procedure report does not answer your questions, please call your gastroenterologist to clarify.  If you requested that your care partner not be given the details of your procedure findings, then the procedure report has been included in a sealed envelope for you to review at your convenience later.  YOU SHOULD EXPECT: Some feelings of bloating in the abdomen. Passage of more gas than usual.  Walking can help get rid of the air that was put into your GI tract during the procedure and reduce the bloating. If you had a lower endoscopy (such as a colonoscopy or flexible sigmoidoscopy) you may notice spotting of blood in your stool or on the toilet paper. If you underwent a bowel prep for your procedure, you may not have a normal bowel movement for a few days.  Please Note:  You might notice some irritation and congestion in your nose or some drainage.  This is from the oxygen used during your procedure.  There is no need for concern and it should clear up in a day or so.  SYMPTOMS TO REPORT IMMEDIATELY:   Following lower endoscopy (colonoscopy or flexible sigmoidoscopy):  Excessive amounts of blood in the stool  Significant tenderness or worsening of abdominal pains  Swelling of the abdomen that is new, acute  Fever of 100F or higher  For urgent or emergent issues, a gastroenterologist can be reached at any hour by calling [phone].   DIET: Your first meal following the procedure should be a small meal and then it is ok to progress to your normal diet. Heavy or fried foods are harder to digest and may make you feel nauseous or bloated.  Likewise, meals heavy in dairy and vegetables can increase bloating.  Drink plenty of fluids but you should avoid alcoholic beverages for 24  hours.  ACTIVITY:  You should plan to take it easy for the rest of today and you should NOT DRIVE or use heavy machinery until tomorrow (because of the sedation medicines used during the test).    FOLLOW UP: Our staff will call the number listed on your records the next business day following your procedure to check on you and address any questions or concerns that you may have regarding the information given to you following your procedure. If we do not reach you, we will leave a message.  However, if you are feeling well and you are not experiencing any problems, there is no need to return our call.  We will assume that you have returned to your regular daily activities without incident.  If any biopsies were taken you will be contacted by phone or by letter within the next 1-3 weeks.  Please call us at [phone] if you have not heard about the biopsies in 3 weeks.    SIGNATURES/CONFIDENTIALITY: You and/or your care partner have signed paperwork which will be entered into your electronic medical record.  These signatures attest to the fact that that the information above on your After Visit Summary has been reviewed and is understood.  Full responsibility of the confidentiality of this discharge information lies with you and/or your care-partner.  Please review polyp handout provided.

## 2015-10-30 ENCOUNTER — Telehealth: Payer: Self-pay

## 2015-10-30 NOTE — Telephone Encounter (Signed)
  Follow up Call-  Call back number 10/29/2015  Post procedure Call Back phone  # [phone]  Permission to leave phone message Yes     Patient questions:  Do you have a fever, pain , or abdominal swelling? No. Pain Score  0 *  Have you tolerated food without any problems? Yes.    Have you been able to return to your normal activities? Yes.    Do you have any questions about your discharge instructions: Diet   No. Medications  No. Follow up visit  No.  Do you have questions or concerns about your Care? No.  Actions: * If pain score is 4 or above: No action needed, pain <4.

## 2015-11-09 ENCOUNTER — Encounter: Payer: Self-pay | Admitting: Gastroenterology

## 2015-12-02 ENCOUNTER — Telehealth: Payer: Self-pay | Admitting: Family Medicine

## 2015-12-02 NOTE — Telephone Encounter (Signed)
Form placed in Dr. Copland's in box for signature. 

## 2015-12-02 NOTE — Telephone Encounter (Signed)
Pt dropped off forms for Dr. Lorelei Pont signature for ins purposes. I put forms in Rx tower.

## 2015-12-03 NOTE — Telephone Encounter (Signed)
done

## 2015-12-03 NOTE — Telephone Encounter (Signed)
Stanton Kidney (wife) notified forms are ready to be picked up at the front desk.

## 2016-02-11 ENCOUNTER — Other Ambulatory Visit: Payer: Self-pay | Admitting: Family Medicine

## 2016-02-12 ENCOUNTER — Other Ambulatory Visit: Payer: Self-pay | Admitting: Family Medicine

## 2016-06-07 ENCOUNTER — Telehealth: Payer: Self-pay | Admitting: Family Medicine

## 2016-06-07 NOTE — Telephone Encounter (Signed)
Mrs hoefert said pt is stubborn and will not go to doctor until 06/09/16. Pt has had dizziness and vomiting on and off since Oct. Most recent episode was 06/04/16. Scheduled appt on 06/09/16 at 9 AM and Mrs Brocato said if happens again prior to appt will cb L\BSC or if at night will take to [facility] or ED. FYI to Dr Lorelei Pont.

## 2016-06-07 NOTE — Telephone Encounter (Signed)
Patient Name: Thomas Blair  DOB: March 20, 1966    Initial Comment Caller states husband is having ear ringing and episodes where he will get extremely dizzy and he has to lay down, then vomits with death odor. Last episode August 20, 2022 night/Saturday morning. Pale now.   Nurse Assessment  Nurse: Raphael Gibney, RN, Vanita Ingles Date/Time Eilene Ghazi Time): 06/07/2016 12:56:56 PM  Confirm and document reason for call. If symptomatic, describe symptoms. ---Caller states spouse has constant ear ringing which he has had for years. He has started having vertigo and then he vomits. BP was normal about an hr after the vertigo. Last episode that vertigo occurred was 08/20/22 night/Saturday morning.  Does the patient have any new or worsening symptoms? ---Yes  Will a triage be completed? ---Yes  Related visit to physician within the last 2 weeks? ---No  Does the PT have any chronic conditions? (i.e. diabetes, asthma, etc.) ---Yes  List chronic conditions. ---HTN  Is this a behavioral health or substance abuse call? ---No     Guidelines    Guideline Title Affirmed Question Affirmed Notes  Dizziness - Vertigo Vomiting occurs with dizziness    Final Disposition User   See Physician within 24 Hours [place], RN, Vanita Ingles    Comments  Pt can not come in for appt until Wednesday. Please call pt back regarding appt.   Referrals  GO TO FACILITY REFUSED   Disagree/Comply: Comply

## 2016-06-09 ENCOUNTER — Ambulatory Visit (INDEPENDENT_AMBULATORY_CARE_PROVIDER_SITE_OTHER): Payer: Managed Care, Other (non HMO) | Admitting: Family Medicine

## 2016-06-09 ENCOUNTER — Encounter: Payer: Self-pay | Admitting: Family Medicine

## 2016-06-09 VITALS — BP 120/80 | HR 57 | Temp 98.4°F | Ht 66.25 in | Wt 197.5 lb

## 2016-06-09 DIAGNOSIS — R42 Dizziness and giddiness: Secondary | ICD-10-CM | POA: Diagnosis not present

## 2016-06-09 DIAGNOSIS — R7303 Prediabetes: Secondary | ICD-10-CM

## 2016-06-09 DIAGNOSIS — H9313 Tinnitus, bilateral: Secondary | ICD-10-CM

## 2016-06-09 DIAGNOSIS — E785 Hyperlipidemia, unspecified: Secondary | ICD-10-CM | POA: Diagnosis not present

## 2016-06-09 DIAGNOSIS — Z125 Encounter for screening for malignant neoplasm of prostate: Secondary | ICD-10-CM

## 2016-06-09 DIAGNOSIS — Z23 Encounter for immunization: Secondary | ICD-10-CM | POA: Diagnosis not present

## 2016-06-09 DIAGNOSIS — G43A0 Cyclical vomiting, not intractable: Secondary | ICD-10-CM

## 2016-06-09 LAB — CBC WITH DIFFERENTIAL/PLATELET
BASOS ABS: 0 10*3/uL (ref 0.0–0.1)
Basophils Relative: 0.5 % (ref 0.0–3.0)
EOS ABS: 0.1 10*3/uL (ref 0.0–0.7)
Eosinophils Relative: 1.5 % (ref 0.0–5.0)
HCT: 47.2 % (ref 39.0–52.0)
Hemoglobin: 16.3 g/dL (ref 13.0–17.0)
LYMPHS ABS: 2.3 10*3/uL (ref 0.7–4.0)
Lymphocytes Relative: 33.3 % (ref 12.0–46.0)
MCHC: 34.4 g/dL (ref 30.0–36.0)
MCV: 85.4 fl (ref 78.0–100.0)
MONOS PCT: 8 % (ref 3.0–12.0)
Monocytes Absolute: 0.6 10*3/uL (ref 0.1–1.0)
NEUTROS PCT: 56.7 % (ref 43.0–77.0)
Neutro Abs: 3.9 10*3/uL (ref 1.4–7.7)
Platelets: 187 10*3/uL (ref 150.0–400.0)
RBC: 5.53 Mil/uL (ref 4.22–5.81)
RDW: 13.6 % (ref 11.5–15.5)
WBC: 6.9 10*3/uL (ref 4.0–10.5)

## 2016-06-09 LAB — HEPATIC FUNCTION PANEL
ALK PHOS: 72 U/L (ref 39–117)
ALT: 21 U/L (ref 0–53)
AST: 14 U/L (ref 0–37)
Albumin: 4.6 g/dL (ref 3.5–5.2)
BILIRUBIN DIRECT: 0.2 mg/dL (ref 0.0–0.3)
BILIRUBIN TOTAL: 1.1 mg/dL (ref 0.2–1.2)
TOTAL PROTEIN: 7.1 g/dL (ref 6.0–8.3)

## 2016-06-09 LAB — LIPID PANEL
Cholesterol: 186 mg/dL (ref 0–200)
HDL: 46 mg/dL (ref >39.00)
LDL CALC: 129 mg/dL — AB (ref 0–99)
NonHDL: 140.32
TRIGLYCERIDES: 55 mg/dL (ref 0.0–149.0)
Total CHOL/HDL Ratio: 4
VLDL: 11 mg/dL (ref 0.0–40.0)

## 2016-06-09 LAB — PSA: PSA: 5.12 ng/mL — ABNORMAL HIGH (ref 0.10–4.00)

## 2016-06-09 LAB — BASIC METABOLIC PANEL
BUN: 21 mg/dL (ref 6–23)
CALCIUM: 9.7 mg/dL (ref 8.4–10.5)
CO2: 29 mEq/L (ref 19–32)
CREATININE: 0.86 mg/dL (ref 0.40–1.50)
Chloride: 104 mEq/L (ref 96–112)
GFR: 99.19 mL/min (ref >60.00)
GLUCOSE: 115 mg/dL — AB (ref 70–99)
Potassium: 4.3 mEq/L (ref 3.5–5.1)
SODIUM: 138 meq/L (ref 135–145)

## 2016-06-09 LAB — HEMOGLOBIN A1C: Hgb A1c MFr Bld: 5.8 % (ref 4.6–6.5)

## 2016-06-09 NOTE — Progress Notes (Signed)
Dr. Frederico Hamman T. Henri Guedes, MD, [facility] [place], [address] Phone: [phone] Fax: [phone]  06/09/2016  Patient: Thomas Blair, MRN: [id number], DOB: 05-29-1965, 51 y.o.  Primary Physician:  Owens Loffler, MD   Chief Complaint  Patient presents with  . Dizziness    going on since September-comes and goes  . Emesis  . Tinnitus   Subjective:   Thomas Blair is a 51 y.o. very pleasant male patient who presents with the following:  Ongoing dizziness since September. He has had approximately 6 discrete episodes that have been very impairing when they have occurred.  At the same time, his tinnitus has significantly worsened.  The symptoms will last somewhere around 30 minutes to less than several hours.  At times he has such severe vertigo that he can hardly walk.  Nausea is quite severe and he often has severe vomiting.  Hit and miss, and his son's ears was ringing. And the next day, got really dizzy and could hardly drive. Got really nauseated and then cleared up, then it came back again really hard.   Urinating normally.   Had some spinning where the room was spinning around him.  Felt off balance some. November. Everything was spinning.  Got really nauseated and laid down. Vertigo only for 30 - 45 minutes.   Also happened on Friday. Still dizzy and could not stand up in the morning. Dizzy part of Sunday.   Neuro exam normal. No other neurological changes have been noted.  No weight loss.  No blood pressure changes.  Past Medical History, Surgical History, Social History, Family History, Problem List, Medications, and Allergies have been reviewed and updated if relevant.  Patient Active Problem List   Diagnosis Date Noted  . Obesity (BMI 30-39.9) 04/30/2013  . Prediabetes 03/20/2013  . PROSTATITIS, ACUTE, CHRONIC 05/07/2009  . ORGANIC IMPOTENCE 05/07/2009  . HYPERLIPIDEMIA 02/05/2009  .  HYPERTENSION 01/01/2009    Past Medical History:  Diagnosis Date  . Allergy   . GERD (gastroesophageal reflux disease)   . Hernia   . History of chicken pox   . Hyperlipidemia   . Hypertension   . Prediabetes 03/20/2013    Past Surgical History:  Procedure Laterality Date  . HERNIA REPAIR      Social History   Social History  . Marital status: Married    Spouse name: N/A  . Number of children: N/A  . Years of education: N/A   Occupational History  . [employer]    Social History Main Topics  . Smoking status: Never Smoker  . Smokeless tobacco: Never Used  . Alcohol use 3.6 oz/week    6 Standard drinks or equivalent per week     Comment: weekly  . Drug use: No  . Sexual activity: Not on file   Other Topics Concern  . Not on file   Social History Narrative   Exercise yes    Family History  Problem Relation Age of Onset  . Colon cancer Neg Hx   . Colon polyps Neg Hx   . Esophageal cancer Neg Hx   . Rectal cancer Neg Hx   . Stomach cancer Neg Hx     Allergies  Allergen Reactions  . Penicillins     REACTION: rash    Medication list reviewed and updated in full in [facility].   GEN: No acute illnesses, no fevers, chills. GI: + n/v during episodes  Pulm: No SOB Interactive and getting along well at home.  Otherwise, ROS is as per the HPI.  Objective:   BP 120/80   Pulse (!) 57   Temp 98.4 F (36.9 C) (Oral)   Ht 5' 6.25" (1.683 m)   Wt 197 lb 8 oz (89.6 kg)   BMI 31.64 kg/m   GEN: WDWN, NAD, Non-toxic, A & O x 3 HEENT: Atraumatic, Normocephalic. Neck supple. No masses, No LAD. No induction of vertigo with head movement. Ears and Nose: No external deformity. CV: RRR, No M/G/R. No JVD. No thrill. No extra heart sounds. PULM: CTA B, no wheezes, crackles, rhonchi. No retractions. No resp. distress. No accessory muscle use. EXTR: No c/c/e NEURO Normal gait.  PSYCH: Normally interactive. Conversant. Not depressed or anxious appearing.   Calm demeanor.   Neuro: CN 2-12 grossly intact. PERRLA. EOMI. Sensation intact throughout. Str 5/5 all extremities. DTR 2+. No clonus. A and o x 4. Romberg neg. Finger nose neg. Heel -shin neg.   Laboratory and Imaging Data: Results for orders placed or performed in visit on 0000000  Basic metabolic panel  Result Value Ref Range   Sodium 138 135 - 145 mEq/L   Potassium 4.3 3.5 - 5.1 mEq/L   Chloride 104 96 - 112 mEq/L   CO2 29 19 - 32 mEq/L   Glucose, Bld 115 (H) 70 - 99 mg/dL   BUN 21 6 - 23 mg/dL   Creatinine, Ser 0.86 0.40 - 1.50 mg/dL   Calcium 9.7 8.4 - 10.5 mg/dL   GFR 99.19 >60.00 mL/min  CBC with Differential/Platelet  Result Value Ref Range   WBC 6.9 4.0 - 10.5 K/uL   RBC 5.53 4.22 - 5.81 Mil/uL   Hemoglobin 16.3 13.0 - 17.0 g/dL   HCT 47.2 39.0 - 52.0 %   MCV 85.4 78.0 - 100.0 fl   MCHC 34.4 30.0 - 36.0 g/dL   RDW 13.6 11.5 - 15.5 %   Platelets 187.0 150.0 - 400.0 K/uL   Neutrophils Relative % 56.7 43.0 - 77.0 %   Lymphocytes Relative 33.3 12.0 - 46.0 %   Monocytes Relative 8.0 3.0 - 12.0 %   Eosinophils Relative 1.5 0.0 - 5.0 %   Basophils Relative 0.5 0.0 - 3.0 %   Neutro Abs 3.9 1.4 - 7.7 K/uL   Lymphs Abs 2.3 0.7 - 4.0 K/uL   Monocytes Absolute 0.6 0.1 - 1.0 K/uL   Eosinophils Absolute 0.1 0.0 - 0.7 K/uL   Basophils Absolute 0.0 0.0 - 0.1 K/uL  Lipid panel  Result Value Ref Range   Cholesterol 186 0 - 200 mg/dL   Triglycerides 55.0 0.0 - 149.0 mg/dL   HDL 46.00 >39.00 mg/dL   VLDL 11.0 0.0 - 40.0 mg/dL   LDL Cholesterol 129 (H) 0 - 99 mg/dL   Total CHOL/HDL Ratio 4    NonHDL 140.32   Hepatic function panel  Result Value Ref Range   Total Bilirubin 1.1 0.2 - 1.2 mg/dL   Bilirubin, Direct 0.2 0.0 - 0.3 mg/dL   Alkaline Phosphatase 72 39 - 117 U/L   AST 14 0 - 37 U/L   ALT 21 0 - 53 U/L   Total Protein 7.1 6.0 - 8.3 g/dL   Albumin 4.6 3.5 - 5.2 g/dL  Hemoglobin A1c  Result Value Ref Range   Hgb A1c MFr Bld 5.8 4.6 - 6.5 %  PSA  Result Value Ref  Range   PSA 5.12 (H) 0.10 - 4.00 ng/mL  Assessment and Plan:   Vertigo - Plan: Basic metabolic panel, CBC with Differential/Platelet, Hepatic function panel, Ambulatory referral to ENT  Prediabetes - Plan: Hemoglobin A1c  Hyperlipidemia LDL goal <100 - Plan: Lipid panel  Screening PSA (prostate specific antigen) - Plan: PSA  Need for prophylactic vaccination and inoculation against influenza - Plan: Flu Vaccine QUAD 36+ mos IM  Tinnitus of both ears  Cyclical vomiting with nausea, intractability of vomiting not specified  Ongoing intermittent severe symptoms for 5 months, debilitating when they happen.  No neurological changes or abnormality on neurological exam.  Concerning for potential longer-lasting in her ear dysfunction such as Mnire's disease.  At this point, I have recommended that the patient see ENT for further evaluation.   Mildly elevated PSA, and the patient has seen urology in the past, I will discuss this with him.  Follow-up: He has CPX with me in a little over a month  Orders Placed This Encounter  Procedures  . Flu Vaccine QUAD 36+ mos IM  . Basic metabolic panel  . CBC with Differential/Platelet  . Lipid panel  . Hepatic function panel  . Hemoglobin A1c  . PSA  . Ambulatory referral to ENT    Signed,  Frederico Hamman T. Adelbert Gaspard, MD   Allergies as of 06/09/2016      Reactions   Penicillins    REACTION: rash      Medication List       Accurate as of 06/09/16 11:59 PM. Always use your most recent med list.          amLODipine 10 MG tablet Commonly known as:  NORVASC TAKE 1 TABLET BY MOUTH EVERY DAY   ibuprofen 200 MG tablet Commonly known as:  ADVIL,MOTRIN Take 200 mg by mouth every 6 (six) hours as needed. Reported on 10/29/2015   metoprolol tartrate 25 MG tablet Commonly known as:  LOPRESSOR TAKE 1 TABLET BY MOUTH TWICE A DAY   multivitamin tablet Take 1 tablet by mouth daily. [employer]   sildenafil 20 MG tablet Commonly  known as:  REVATIO Generic Revatio / Sildanefil 20 mg. 2 - 5 tabs 30 mins prior to intercourse. #50, 11 refills.

## 2016-06-09 NOTE — Progress Notes (Signed)
Pre visit review using our clinic review tool, if applicable. No additional management support is needed unless otherwise documented below in the visit note. 

## 2016-06-09 NOTE — Patient Instructions (Signed)

## 2016-07-07 ENCOUNTER — Other Ambulatory Visit: Payer: Managed Care, Other (non HMO)

## 2016-07-14 ENCOUNTER — Ambulatory Visit (INDEPENDENT_AMBULATORY_CARE_PROVIDER_SITE_OTHER): Payer: 59 | Admitting: Family Medicine

## 2016-07-14 ENCOUNTER — Encounter: Payer: Self-pay | Admitting: Family Medicine

## 2016-07-14 ENCOUNTER — Encounter (INDEPENDENT_AMBULATORY_CARE_PROVIDER_SITE_OTHER): Payer: Self-pay

## 2016-07-14 VITALS — BP 126/90 | HR 63 | Temp 98.5°F | Ht 66.5 in | Wt 193.0 lb

## 2016-07-14 DIAGNOSIS — R972 Elevated prostate specific antigen [PSA]: Secondary | ICD-10-CM | POA: Diagnosis not present

## 2016-07-14 DIAGNOSIS — Z0000 Encounter for general adult medical examination without abnormal findings: Secondary | ICD-10-CM | POA: Diagnosis not present

## 2016-07-14 MED ORDER — METOPROLOL TARTRATE 25 MG PO TABS: 25.0000 mg | ORAL_TABLET | Freq: Two times a day (BID) | ORAL | 3 refills | Status: DC

## 2016-07-14 MED ORDER — SILDENAFIL CITRATE 20 MG PO TABS: ORAL_TABLET | ORAL | 11 refills | Status: DC

## 2016-07-14 MED ORDER — AMLODIPINE BESYLATE 10 MG PO TABS: 10.0000 mg | ORAL_TABLET | Freq: Every day | ORAL | 3 refills | Status: DC

## 2016-07-14 NOTE — Progress Notes (Signed)
Pre visit review using our clinic review tool, if applicable. No additional management support is needed unless otherwise documented below in the visit note. 

## 2016-07-14 NOTE — Progress Notes (Signed)
Dr. Frederico Hamman T. Aslin Farinas, MD, [facility] [place], [address] Phone: [phone] Fax: [phone]  07/14/2016  Patient: Thomas Blair, MRN: [id number], DOB: [birth year], 51 y.o.  Primary Physician:  Owens Loffler, MD   Chief Complaint  Patient presents with  . Annual Exam   Subjective:   Thomas Blair is a 51 y.o. pleasant patient who presents with the following:  Preventative Health Maintenance Visit:  Health Maintenance Summary Reviewed and updated, unless pt declines services.  Tobacco History Reviewed. Alcohol: No concerns, no excessive use Exercise Habits: Some activity, rec at least 30 mins 5 times a week STD concerns: no risk or activity to increase risk Drug Use: None Encouraged self-testicular check  Health Maintenance  Topic Date Due  . Hepatitis C Screening  1965/09/01  . URINE MICROALBUMIN  04/07/1974  . HIV Screening  04/08/1979  . COLONOSCOPY  10/29/2020  . TETANUS/TDAP  07/09/2024  . INFLUENZA VACCINE  Completed   Immunization History  Administered Date(s) Administered  . Influenza Split 02/15/2011, 03/06/2012  . Influenza Whole 02/05/2009  . Influenza, Seasonal, Injecte, Preservative Fre 03/11/2014  . Influenza,inj,Quad PF,36+ Mos 03/19/2013, 06/09/2016  . Tdap 07/10/2014   Patient Active Problem List   Diagnosis Date Noted  . Obesity (BMI 30-39.9) 04/30/2013  . Prediabetes 03/20/2013  . PROSTATITIS, ACUTE, CHRONIC 05/07/2009  . ORGANIC IMPOTENCE 05/07/2009  . Hyperlipidemia LDL goal <100 02/05/2009  . HYPERTENSION 01/01/2009   Past Medical History:  Diagnosis Date  . Allergy   . GERD (gastroesophageal reflux disease)   . Hernia   . History of chicken pox   . Hyperlipidemia   . Hypertension   . Prediabetes 03/20/2013   Past Surgical History:  Procedure Laterality Date  . HERNIA REPAIR     Social History   Social History  . Marital status: Married    Spouse  name: N/A  . Number of children: N/A  . Years of education: N/A   Occupational History  . [employer]    Social History Main Topics  . Smoking status: Never Smoker  . Smokeless tobacco: Never Used  . Alcohol use 3.6 oz/week    6 Standard drinks or equivalent per week     Comment: weekly  . Drug use: No  . Sexual activity: Not on file   Other Topics Concern  . Not on file   Social History Narrative   Exercise yes   Family History  Problem Relation Age of Onset  . Colon cancer Neg Hx   . Colon polyps Neg Hx   . Esophageal cancer Neg Hx   . Rectal cancer Neg Hx   . Stomach cancer Neg Hx    Allergies  Allergen Reactions  . Penicillins     REACTION: rash    Medication list has been reviewed and updated.   General: Denies fever, chills, sweats. No significant weight loss. Eyes: Denies blurring,significant itching ENT: Denies earache, sore throat, and hoarseness. Cardiovascular: Denies chest pains, palpitations, dyspnea on exertion Respiratory: Denies cough, dyspnea at rest,wheeezing Breast: no concerns about lumps GI: Denies nausea, vomiting, diarrhea, constipation, change in bowel habits, abdominal pain, melena, hematochezia GU: Denies penile discharge, ED, urinary flow / outflow problems. No STD concerns. Musculoskeletal: Denies back pain. Some L knee pain Derm: Denies rash, itching Neuro: Denies  paresthesias, frequent falls, frequent headaches Psych: Denies depression, anxiety Endocrine: Denies cold intolerance, heat intolerance, polydipsia Heme: Denies enlarged lymph nodes Allergy: No hayfever  Objective:   BP 126/90   Pulse 63   Temp 98.5 F (36.9 C) (Oral)   Ht 5' 6.5" (1.689 m)   Wt 193 lb (87.5 kg)   BMI 30.68 kg/m  Ideal Body Weight: Weight in (lb) to have BMI = 25: 156.9  No exam data present  GEN: well developed, well nourished, no acute distress Eyes: conjunctiva and lids normal, PERRLA, EOMI ENT: TM clear, nares clear, oral exam WNL Neck:  supple, no lymphadenopathy, no thyromegaly, no JVD Pulm: clear to auscultation and percussion, respiratory effort normal CV: regular rate and rhythm, S1-S2, no murmur, rub or gallop, no bruits, peripheral pulses normal and symmetric, no cyanosis, clubbing, edema or varicosities GI: soft, non-tender; no hepatosplenomegaly, masses; active bowel sounds all quadrants GU: no hernia, testicular mass, penile discharge Lymph: no cervical, axillary or inguinal adenopathy MSK: mild L medial joint line pain SKIN: clear, good turgor, color WNL, no rashes, lesions, or ulcerations Neuro: normal mental status, normal strength, sensation, and motion Psych: alert; oriented to person, place and time, normally interactive and not anxious or depressed in appearance.  All labs reviewed with patient.  Lipids:    Component Value Date/Time   CHOL 186 06/09/2016 0955   TRIG 55.0 06/09/2016 0955   HDL 46.00 06/09/2016 0955   LDLDIRECT 127.9 10/22/2009 0840   VLDL 11.0 06/09/2016 0955   CHOLHDL 4 06/09/2016 0955   CBC: CBC Latest Ref Rng & Units 06/09/2016 07/09/2015 07/10/2014  WBC 4.0 - 10.5 K/uL 6.9 4.7 8.3  Hemoglobin 13.0 - 17.0 g/dL 16.3 15.3 15.6  Hematocrit 39.0 - 52.0 % 47.2 44.5 45.8  Platelets 150.0 - 400.0 K/uL 187.0 168.0 329.9    Basic Metabolic Panel:    Component Value Date/Time   NA 138 06/09/2016 0955   K 4.3 06/09/2016 0955   CL 104 06/09/2016 0955   CO2 29 06/09/2016 0955   BUN 21 06/09/2016 0955   CREATININE 0.86 06/09/2016 0955   GLUCOSE 115 (H) 06/09/2016 0955   CALCIUM 9.7 06/09/2016 0955   Hepatic Function Latest Ref Rng & Units 06/09/2016 07/09/2015 07/10/2014  Total Protein 6.0 - 8.3 g/dL 7.1 7.0 7.5  Albumin 3.5 - 5.2 g/dL 4.6 4.3 4.6  AST 0 - 37 U/L _0 ALT 0 - 53 U/L _1 Alk Phosphatase 39 - 117 U/L 72 64 77  Total Bilirubin 0.2 - 1.2 mg/dL 1.1 0.7 1.1  Bilirubin, Direct 0.0 - 0.3 mg/dL 0.2 0.1 0.2    No results found for: TSH Lab Results  Component Value  Date   PSA 5.12 (H) 06/09/2016   PSA 4.34 (H) 07/09/2015   PSA 7.48 (H) 08/21/2014    Assessment and Plan:   Healthcare maintenance  Elevated PSA, less than 10 ng/ml - Plan: PSA, total and free  Recheck psa - previously seen at urology  Health Maintenance Exam: The patient's preventative maintenance and recommended screening tests for an annual wellness exam were reviewed in full today. Brought up to date unless services declined.  Counselled on the importance of diet, exercise, and its role in overall health and mortality. The patient's FH and SH was reviewed, including their home life, tobacco status, and drug and alcohol status.  Follow-up in 1 year for physical exam or additional follow-up below.  Follow-up: No Follow-up on file. Or follow-up in 1 year if not noted.  Meds ordered this encounter  Medications  . sildenafil (REVATIO) 20 MG tablet    Sig: Generic Revatio /  Sildanefil 20 mg. 2 - 5 tabs 30 mins prior to intercourse. #50, 11 refills.    Dispense:  50 tablet    Refill:  11  . metoprolol tartrate (LOPRESSOR) 25 MG tablet    Sig: Take 1 tablet (25 mg total) by mouth 2 (two) times daily.    Dispense:  180 tablet    Refill:  3  . amLODipine (NORVASC) 10 MG tablet    Sig: Take 1 tablet (10 mg total) by mouth daily.    Dispense:  90 tablet    Refill:  3   Medications Discontinued During This Encounter  Medication Reason  . sildenafil (REVATIO) 20 MG tablet Reorder  . metoprolol tartrate (LOPRESSOR) 25 MG tablet Reorder  . amLODipine (NORVASC) 10 MG tablet Reorder   Orders Placed This Encounter  Procedures  . PSA, total and free    Signed,  Rubyann Lingle T. Lipa Knauff, MD   Allergies as of 07/14/2016      Reactions   Penicillins    REACTION: rash      Medication List       Accurate as of 07/14/16  9:05 AM. Always use your most recent med list.          amLODipine 10 MG tablet Commonly known as:  NORVASC Take 1 tablet (10 mg total) by mouth daily.     ibuprofen 200 MG tablet Commonly known as:  ADVIL,MOTRIN Take 200 mg by mouth every 6 (six) hours as needed. Reported on 10/29/2015   metoprolol tartrate 25 MG tablet Commonly known as:  LOPRESSOR Take 1 tablet (25 mg total) by mouth 2 (two) times daily.   multivitamin tablet Take 1 tablet by mouth daily. Alive [employer]   sildenafil 20 MG tablet Commonly known as:  REVATIO Generic Revatio / Sildanefil 20 mg. 2 - 5 tabs 30 mins prior to intercourse. #50, 11 refills.

## 2016-07-15 LAB — PSA, TOTAL AND FREE
PSA, % FREE: 40 % (ref >25)
PSA, FREE: 2.8 ng/mL
PSA, TOTAL: 7 ng/mL — AB (ref <=4.0)

## 2016-07-19 ENCOUNTER — Telehealth: Payer: Self-pay | Admitting: Family Medicine

## 2016-07-19 NOTE — Telephone Encounter (Signed)
Patient returned Donna's call. °

## 2016-07-19 NOTE — Telephone Encounter (Signed)
PSA results discussed with Thomas Blair.  See result note.

## 2016-07-20 ENCOUNTER — Other Ambulatory Visit: Payer: Self-pay | Admitting: Family Medicine

## 2016-07-20 DIAGNOSIS — R972 Elevated prostate specific antigen [PSA]: Secondary | ICD-10-CM

## 2017-02-15 ENCOUNTER — Telehealth: Payer: Self-pay | Admitting: Family Medicine

## 2017-02-15 NOTE — Telephone Encounter (Signed)
Pt dropped off a [facility] form . Will be in Dr Edilia Bo box. Thank you!

## 2017-02-15 NOTE — Telephone Encounter (Signed)
Mrs Reggio brought in a form for Dr Lorelei Pont to sign for his employer. I will put in the RX tower.

## 2017-02-15 NOTE — Telephone Encounter (Signed)
Form placed in Dr. Lillie Fragmin in box for signature.

## 2017-02-15 NOTE — Telephone Encounter (Signed)
Will review 

## 2017-02-18 NOTE — Telephone Encounter (Signed)
I think that I already did this per memory?

## 2017-02-18 NOTE — Telephone Encounter (Signed)
Patient's wife asked when form is completed to fax the form to [facility] and mail a copy to patient.

## 2017-02-18 NOTE — Telephone Encounter (Signed)
[place] form faxed to [phone] and original mailed to patient's home address as requested.

## 2017-02-25 ENCOUNTER — Other Ambulatory Visit: Payer: Self-pay | Admitting: Family Medicine

## 2017-05-06 ENCOUNTER — Other Ambulatory Visit: Payer: Self-pay | Admitting: *Deleted

## 2017-05-06 ENCOUNTER — Other Ambulatory Visit: Payer: Self-pay | Admitting: Family Medicine

## 2017-07-14 ENCOUNTER — Other Ambulatory Visit: Payer: Self-pay | Admitting: Family Medicine

## 2017-07-14 DIAGNOSIS — R739 Hyperglycemia, unspecified: Secondary | ICD-10-CM

## 2017-07-14 DIAGNOSIS — Z0000 Encounter for general adult medical examination without abnormal findings: Secondary | ICD-10-CM

## 2017-07-15 ENCOUNTER — Other Ambulatory Visit (INDEPENDENT_AMBULATORY_CARE_PROVIDER_SITE_OTHER): Payer: 59

## 2017-07-15 ENCOUNTER — Encounter: Payer: Self-pay | Admitting: Family Medicine

## 2017-07-15 DIAGNOSIS — R739 Hyperglycemia, unspecified: Secondary | ICD-10-CM | POA: Diagnosis not present

## 2017-07-15 DIAGNOSIS — Z0000 Encounter for general adult medical examination without abnormal findings: Secondary | ICD-10-CM | POA: Diagnosis not present

## 2017-07-15 LAB — BASIC METABOLIC PANEL
BUN: 22 mg/dL (ref 6–23)
CALCIUM: 9.6 mg/dL (ref 8.4–10.5)
CO2: 29 mEq/L (ref 19–32)
Chloride: 103 mEq/L (ref 96–112)
Creatinine, Ser: 0.97 mg/dL (ref 0.40–1.50)
GFR: 85.96 mL/min (ref >60.00)
Glucose, Bld: 120 mg/dL — ABNORMAL HIGH (ref 70–99)
Potassium: 3.9 mEq/L (ref 3.5–5.1)
SODIUM: 140 meq/L (ref 135–145)

## 2017-07-15 LAB — CBC WITH DIFFERENTIAL/PLATELET
BASOS PCT: 0.3 % (ref 0.0–3.0)
Basophils Absolute: 0 10*3/uL (ref 0.0–0.1)
Eosinophils Absolute: 0.1 10*3/uL (ref 0.0–0.7)
Eosinophils Relative: 2.3 % (ref 0.0–5.0)
HCT: 44.9 % (ref 39.0–52.0)
Hemoglobin: 15.6 g/dL (ref 13.0–17.0)
LYMPHS ABS: 1.7 10*3/uL (ref 0.7–4.0)
Lymphocytes Relative: 33.3 % (ref 12.0–46.0)
MCHC: 34.8 g/dL (ref 30.0–36.0)
MCV: 84.8 fl (ref 78.0–100.0)
MONO ABS: 0.4 10*3/uL (ref 0.1–1.0)
Monocytes Relative: 7.6 % (ref 3.0–12.0)
NEUTROS ABS: 2.8 10*3/uL (ref 1.4–7.7)
NEUTROS PCT: 56.5 % (ref 43.0–77.0)
PLATELETS: 188 10*3/uL (ref 150.0–400.0)
RBC: 5.29 Mil/uL (ref 4.22–5.81)
RDW: 13.9 % (ref 11.5–15.5)
WBC: 5 10*3/uL (ref 4.0–10.5)

## 2017-07-15 LAB — HEPATIC FUNCTION PANEL
ALBUMIN: 4.3 g/dL (ref 3.5–5.2)
ALK PHOS: 61 U/L (ref 39–117)
ALT: 13 U/L (ref 0–53)
AST: 11 U/L (ref 0–37)
Bilirubin, Direct: 0.2 mg/dL (ref 0.0–0.3)
Total Bilirubin: 0.7 mg/dL (ref 0.2–1.2)
Total Protein: 7.2 g/dL (ref 6.0–8.3)

## 2017-07-15 LAB — HEMOGLOBIN A1C: Hgb A1c MFr Bld: 5.7 % (ref 4.6–6.5)

## 2017-07-15 LAB — LIPID PANEL
CHOLESTEROL: 186 mg/dL (ref 0–200)
HDL: 48 mg/dL (ref >39.00)
LDL Cholesterol: 124 mg/dL — ABNORMAL HIGH (ref 0–99)
NonHDL: 138.21
Total CHOL/HDL Ratio: 4
Triglycerides: 69 mg/dL (ref 0.0–149.0)
VLDL: 13.8 mg/dL (ref 0.0–40.0)

## 2017-07-18 ENCOUNTER — Other Ambulatory Visit: Payer: Self-pay

## 2017-07-18 ENCOUNTER — Encounter: Payer: Self-pay | Admitting: Family Medicine

## 2017-07-18 ENCOUNTER — Ambulatory Visit (INDEPENDENT_AMBULATORY_CARE_PROVIDER_SITE_OTHER): Payer: 59 | Admitting: Family Medicine

## 2017-07-18 VITALS — BP 120/74 | HR 61 | Temp 98.5°F | Ht 66.5 in | Wt 206.5 lb

## 2017-07-18 DIAGNOSIS — Z0000 Encounter for general adult medical examination without abnormal findings: Secondary | ICD-10-CM

## 2017-07-18 LAB — PSA, TOTAL AND FREE
PSA, FREE: 1.9 ng/mL
PSA, Total: 11 ng/mL — ABNORMAL HIGH (ref <=4.0)

## 2017-07-18 MED ORDER — AMLODIPINE BESYLATE 10 MG PO TABS: 10.0000 mg | ORAL_TABLET | Freq: Every day | ORAL | 3 refills | Status: DC

## 2017-07-18 MED ORDER — SILDENAFIL CITRATE 20 MG PO TABS: ORAL_TABLET | ORAL | 11 refills | Status: DC

## 2017-07-18 MED ORDER — METOPROLOL TARTRATE 25 MG PO TABS: 25.0000 mg | ORAL_TABLET | Freq: Two times a day (BID) | ORAL | 3 refills | Status: DC

## 2017-07-18 NOTE — Progress Notes (Signed)
Dr. Frederico Hamman T. Gilman Olazabal, MD, [facility] [place], [address] Phone: [phone] Fax: [phone]  07/18/2017  Patient: Thomas Blair, MRN: [id number], DOB: 10/25/1965, 52 y.o.  Primary Physician:  Owens Loffler, MD   Chief Complaint  Patient presents with  . Annual Exam   Subjective:   Vahan MICHEAL MURAD is a 52 y.o. pleasant patient who presents with the following:  Preventative Health Maintenance Visit:  Health Maintenance Summary Reviewed and updated, unless pt declines services.  Tobacco History Reviewed. Alcohol: No concerns, no excessive use Exercise Habits: Some activity, rec at least 30 mins 5 times a week STD concerns: no risk or activity to increase risk Drug Use: None Encouraged self-testicular check  R thumb. Pain in the thumb. Has gotten better. With new job.  Health Maintenance  Topic Date Due  . Hepatitis C Screening  1965/12/01  . HIV Screening  04/08/1979  . INFLUENZA VACCINE  08/07/2017 (Originally 12/08/2016)  . COLONOSCOPY  10/29/2020  . TETANUS/TDAP  07/09/2024   Immunization History  Administered Date(s) Administered  . Influenza Split 02/15/2011, 03/06/2012  . Influenza Whole 02/05/2009  . Influenza, Seasonal, Injecte, Preservative Fre 03/11/2014  . Influenza,inj,Quad PF,6+ Mos 03/19/2013, 06/09/2016  . Tdap 07/10/2014   Patient Active Problem List   Diagnosis Date Noted  . Obesity (BMI 30-39.9) 04/30/2013  . Prediabetes 03/20/2013  . PROSTATITIS, ACUTE, CHRONIC 05/07/2009  . ORGANIC IMPOTENCE 05/07/2009  . Hyperlipidemia LDL goal <100 02/05/2009  . HYPERTENSION 01/01/2009   Past Medical History:  Diagnosis Date  . Allergy   . GERD (gastroesophageal reflux disease)   . Hernia   . History of chicken pox   . Hyperlipidemia   . Hypertension   . Prediabetes 03/20/2013   Past Surgical History:  Procedure Laterality Date  . HERNIA REPAIR     Social History    Socioeconomic History  . Marital status: Married    Spouse name: Not on file  . Number of children: Not on file  . Years of education: Not on file  . Highest education level: Not on file  Social Needs  . Financial resource strain: Not on file  . Food insecurity - worry: Not on file  . Food insecurity - inability: Not on file  . Transportation needs - medical: Not on file  . Transportation needs - non-medical: Not on file  Occupational History  . Occupation: [employer]  Tobacco Use  . Smoking status: Never Smoker  . Smokeless tobacco: Never Used  Substance and Sexual Activity  . Alcohol use: Yes    Alcohol/week: 3.6 oz    Types: 6 Standard drinks or equivalent per week    Comment: weekly  . Drug use: No  . Sexual activity: Not on file  Other Topics Concern  . Not on file  Social History Narrative   Exercise yes   Family History  Problem Relation Age of Onset  . Colon cancer Neg Hx   . Colon polyps Neg Hx   . Esophageal cancer Neg Hx   . Rectal cancer Neg Hx   . Stomach cancer Neg Hx    Allergies  Allergen Reactions  . Penicillins     REACTION: rash    Medication list has been reviewed and updated.   General: Denies fever, chills, sweats. No significant weight loss. Eyes: Denies blurring,significant itching ENT: Denies earache, sore throat, and hoarseness. Cardiovascular: Denies chest pains, palpitations, dyspnea on exertion Respiratory: Denies cough,  dyspnea at rest,wheeezing Breast: no concerns about lumps GI: Denies nausea, vomiting, diarrhea, constipation, change in bowel habits, abdominal pain, melena, hematochezia GU: Denies penile discharge, ED, urinary flow / outflow problems. No STD concerns. Musculoskeletal: as above Derm: Denies rash, itching Neuro: Denies  paresthesias, frequent falls, frequent headaches Psych: Denies depression, anxiety Endocrine: Denies cold intolerance, heat intolerance, polydipsia Heme: Denies enlarged lymph nodes Allergy:  No hayfever  Objective:   BP 120/74   Pulse 61   Temp 98.5 F (36.9 C) (Oral)   Ht 5' 6.5" (1.689 m)   Wt 206 lb 8 oz (93.7 kg)   BMI 32.83 kg/m  Ideal Body Weight: Weight in (lb) to have BMI = 25: 156.9  No exam data present  GEN: well developed, well nourished, no acute distress Eyes: conjunctiva and lids normal, PERRLA, EOMI ENT: TM clear, nares clear, oral exam WNL Neck: supple, no lymphadenopathy, no thyromegaly, no JVD Pulm: clear to auscultation and percussion, respiratory effort normal CV: regular rate and rhythm, S1-S2, no murmur, rub or gallop, no bruits, peripheral pulses normal and symmetric, no cyanosis, clubbing, edema or varicosities GI: soft, non-tender; no hepatosplenomegaly, masses; active bowel sounds all quadrants GU: no hernia, testicular mass, penile discharge Lymph: no cervical, axillary or inguinal adenopathy MSK: gait normal, muscle tone and strength WNL, no joint swelling, effusions, discoloration, crepitus  SKIN: clear, good turgor, color WNL, no rashes, lesions, or ulcerations Neuro: normal mental status, normal strength, sensation, and motion Psych: alert; oriented to person, place and time, normally interactive and not anxious or depressed in appearance. All labs reviewed with patient.  Lipids:    Component Value Date/Time   CHOL 186 07/15/2017 0753   TRIG 69.0 07/15/2017 0753   HDL 48.00 07/15/2017 0753   LDLDIRECT 127.9 10/22/2009 0840   VLDL 13.8 07/15/2017 0753   CHOLHDL 4 07/15/2017 0753   CBC: CBC Latest Ref Rng & Units 07/15/2017 06/09/2016 07/09/2015  WBC 4.0 - 10.5 K/uL 5.0 6.9 4.7  Hemoglobin 13.0 - 17.0 g/dL 15.6 16.3 15.3  Hematocrit 39.0 - 52.0 % 44.9 47.2 44.5  Platelets 150.0 - 400.0 K/uL 188.0 187.0 782.4    Basic Metabolic Panel:    Component Value Date/Time   NA 140 07/15/2017 0753   K 3.9 07/15/2017 0753   CL 103 07/15/2017 0753   CO2 29 07/15/2017 0753   BUN 22 07/15/2017 0753   CREATININE 0.97 07/15/2017 0753    GLUCOSE 120 (H) 07/15/2017 0753   CALCIUM 9.6 07/15/2017 0753   Hepatic Function Latest Ref Rng & Units 07/15/2017 06/09/2016 07/09/2015  Total Protein 6.0 - 8.3 g/dL 7.2 7.1 7.0  Albumin 3.5 - 5.2 g/dL 4.3 4.6 4.3  AST 0 - 37 U/L 11 14 15   ALT 0 - 53 U/L 13 21 17   Alk Phosphatase 39 - 117 U/L 61 72 64  Total Bilirubin 0.2 - 1.2 mg/dL 0.7 1.1 0.7  Bilirubin, Direct 0.0 - 0.3 mg/dL 0.2 0.2 0.1    No results found for: TSH Lab Results  Component Value Date   PSA 5.12 (H) 06/09/2016   PSA 4.34 (H) 07/09/2015   PSA 7.48 (H) 08/21/2014    Assessment and Plan:   Healthcare maintenance  Health Maintenance Exam: The patient's preventative maintenance and recommended screening tests for an annual wellness exam were reviewed in full today. Brought up to date unless services declined.  Counselled on the importance of diet, exercise, and its role in overall health and mortality. The patient's FH and SH was  reviewed, including their home life, tobacco status, and drug and alcohol status.  Follow-up in 1 year for physical exam or additional follow-up below.  Follow-up: No Follow-up on file. Or follow-up in 1 year if not noted.  Meds ordered this encounter  Medications  . amLODipine (NORVASC) 10 MG tablet    Sig: Take 1 tablet (10 mg total) by mouth daily.    Dispense:  90 tablet    Refill:  3  . metoprolol tartrate (LOPRESSOR) 25 MG tablet    Sig: Take 1 tablet (25 mg total) by mouth 2 (two) times daily.    Dispense:  180 tablet    Refill:  3  . sildenafil (REVATIO) 20 MG tablet    Sig: TAKE 2 TO 5 TABLETS 30 MIN PRIOR TO INTERCOURSE    Dispense:  50 tablet    Refill:  11   Signed,  Teagen Mcleary T. Monserratt Knezevic, MD   Allergies as of 07/18/2017      Reactions   Penicillins    REACTION: rash      Medication List        Accurate as of 07/18/17  9:13 AM. Always use your most recent med list.          alfuzosin 10 MG 24 hr tablet Commonly known as:  UROXATRAL Take 10 mg by mouth  at bedtime.   amLODipine 10 MG tablet Commonly known as:  NORVASC Take 1 tablet (10 mg total) by mouth daily.   ibuprofen 200 MG tablet Commonly known as:  ADVIL,MOTRIN Take 200 mg by mouth every 6 (six) hours as needed. Reported on 10/29/2015   metoprolol tartrate 25 MG tablet Commonly known as:  LOPRESSOR Take 1 tablet (25 mg total) by mouth 2 (two) times daily.   multivitamin tablet Take 1 tablet by mouth daily. [employer]   sildenafil 20 MG tablet Commonly known as:  REVATIO TAKE 2 TO 5 TABLETS 30 MIN PRIOR TO INTERCOURSE   triamterene-hydrochlorothiazide 37.5-25 MG tablet Commonly known as:  MAXZIDE-25 Take 1 tablet by mouth daily.

## 2018-01-02 ENCOUNTER — Emergency Department: Payer: BLUE CROSS/BLUE SHIELD

## 2018-01-02 ENCOUNTER — Encounter: Payer: Self-pay | Admitting: Emergency Medicine

## 2018-01-02 ENCOUNTER — Other Ambulatory Visit: Payer: Self-pay

## 2018-01-02 ENCOUNTER — Ambulatory Visit: Payer: Self-pay | Admitting: Family Medicine

## 2018-01-02 ENCOUNTER — Emergency Department
Admission: EM | Admit: 2018-01-02 | Discharge: 2018-01-02 | Disposition: A | Discharge status: Home / Self Care | Payer: BLUE CROSS/BLUE SHIELD | Attending: Emergency Medicine | Admitting: Emergency Medicine

## 2018-01-02 DIAGNOSIS — X509XXA Other and unspecified overexertion or strenuous movements or postures, initial encounter: Secondary | ICD-10-CM | POA: Insufficient documentation

## 2018-01-02 DIAGNOSIS — Y939 Activity, unspecified: Secondary | ICD-10-CM | POA: Diagnosis not present

## 2018-01-02 DIAGNOSIS — S80911A Unspecified superficial injury of right knee, initial encounter: Secondary | ICD-10-CM | POA: Diagnosis present

## 2018-01-02 DIAGNOSIS — M25561 Pain in right knee: Secondary | ICD-10-CM

## 2018-01-02 DIAGNOSIS — Y929 Unspecified place or not applicable: Secondary | ICD-10-CM | POA: Diagnosis not present

## 2018-01-02 DIAGNOSIS — M25461 Effusion, right knee: Secondary | ICD-10-CM

## 2018-01-02 DIAGNOSIS — I10 Essential (primary) hypertension: Secondary | ICD-10-CM | POA: Insufficient documentation

## 2018-01-02 DIAGNOSIS — Y999 Unspecified external cause status: Secondary | ICD-10-CM | POA: Diagnosis not present

## 2018-01-02 DIAGNOSIS — Z79899 Other long term (current) drug therapy: Secondary | ICD-10-CM | POA: Insufficient documentation

## 2018-01-02 MED ORDER — TRAMADOL HCL 50 MG PO TABS
50.0000 mg | ORAL_TABLET | Freq: Once | ORAL | Status: AC
  Administered 2018-01-02: 50 mg via ORAL
  Filled 2018-01-02: qty 1

## 2018-01-02 MED ORDER — NAPROXEN 500 MG PO TABS: 500.0000 mg | ORAL_TABLET | Freq: Two times a day (BID) | ORAL | Status: DC

## 2018-01-02 MED ORDER — TRAMADOL HCL 50 MG PO TABS: 50.0000 mg | ORAL_TABLET | Freq: Two times a day (BID) | ORAL | 0 refills | Status: DC | PRN

## 2018-01-02 MED ORDER — NAPROXEN 500 MG PO TABS
500.0000 mg | ORAL_TABLET | Freq: Once | ORAL | Status: AC
  Administered 2018-01-02: 500 mg via ORAL
  Filled 2018-01-02: qty 1

## 2018-01-02 NOTE — Discharge Instructions (Addendum)
Follow discharge care instructions.  Advised to purchase over-the-counter elastic knee support.

## 2018-01-02 NOTE — ED Notes (Signed)
See triage note  Presents with right lower leg pain  States pain is mainly posterior /lateral right knee and radiates into calf  Ambulates well  denies any injury

## 2018-01-02 NOTE — ED Triage Notes (Signed)
R foot pain x 1 week without injury.

## 2018-01-02 NOTE — ED Provider Notes (Signed)
[facility] Emergency Department Provider Note   ____________________________________________   First MD Initiated Contact with Patient 01/02/18 [id number]     (approximate)  I have reviewed the triage vital signs and the nursing notes.   HISTORY  Chief Complaint Foot Pain and Knee Pain    HPI Thomas Blair is a 52 y.o. male patient complain of 1 week of right posterior knee and calf pain.  Patient states pain increased with ambulation.  Patient state he knows a popping sensation last week.  Patient denies swelling or erythema.  Patient denies dyspnea.  Patient has a history of prolonged walking secondary to his job.  Patient rates pain as 7/10.  Patient described the pain is "aching".  No palliative measure for complaint.   Past Medical History:  Diagnosis Date  . Allergy   . GERD (gastroesophageal reflux disease)   . Hernia   . History of chicken pox   . Hyperlipidemia   . Hypertension   . Prediabetes 03/20/2013    Patient Active Problem List   Diagnosis Date Noted  . Obesity (BMI 30-39.9) 04/30/2013  . Prediabetes 03/20/2013  . PROSTATITIS, ACUTE, CHRONIC 05/07/2009  . ORGANIC IMPOTENCE 05/07/2009  . Hyperlipidemia LDL goal <100 02/05/2009  . HYPERTENSION 01/01/2009    Past Surgical History:  Procedure Laterality Date  . HERNIA REPAIR      Prior to Admission medications   Medication Sig Start Date End Date Taking? Authorizing Provider  alfuzosin (UROXATRAL) 10 MG 24 hr tablet Take 10 mg by mouth at bedtime. 06/02/17   [provider]  amLODipine (NORVASC) 10 MG tablet Take 1 tablet (10 mg total) by mouth daily. 07/18/17   Copland, Frederico Hamman, MD  ibuprofen (ADVIL,MOTRIN) 200 MG tablet Take 200 mg by mouth every 6 (six) hours as needed. Reported on 10/29/2015    [provider]  metoprolol tartrate (LOPRESSOR) 25 MG tablet Take 1 tablet (25 mg total) by mouth 2 (two) times daily. 07/18/17   Copland, Frederico Hamman, MD  Multiple Vitamin  (MULTIVITAMIN) tablet Take 1 tablet by mouth daily. Alive Men's Energy    [provider]  naproxen (NAPROSYN) 500 MG tablet Take 1 tablet (500 mg total) by mouth 2 (two) times daily with a meal. 01/02/18   Sable Feil, PA-C  sildenafil (REVATIO) 20 MG tablet TAKE 2 TO 5 TABLETS 30 MIN PRIOR TO INTERCOURSE 07/18/17   Copland, Frederico Hamman, MD  traMADol (ULTRAM) 50 MG tablet Take 1 tablet (50 mg total) by mouth every 12 (twelve) hours as needed. 01/02/18   Sable Feil, PA-C  triamterene-hydrochlorothiazide (MAXZIDE-25) 37.5-25 MG tablet Take 1 tablet by mouth daily. 05/30/17   [provider]    Allergies Penicillins  Family History  Problem Relation Age of Onset  . Colon cancer Neg Hx   . Colon polyps Neg Hx   . Esophageal cancer Neg Hx   . Rectal cancer Neg Hx   . Stomach cancer Neg Hx     Social History Social History   Tobacco Use  . Smoking status: Never Smoker  . Smokeless tobacco: Never Used  Substance Use Topics  . Alcohol use: Yes    Alcohol/week: 6.0 standard drinks    Types: 6 Standard drinks or equivalent per week    Comment: weekly  . Drug use: No    Review of Systems  Constitutional: No fever/chills Eyes: No visual changes. ENT: No sore throat. Cardiovascular: Denies chest pain. Respiratory: Denies shortness of breath. Gastrointestinal: No abdominal  pain.  No nausea, no vomiting.  No diarrhea.  No constipation. Genitourinary: Negative for dysuria. Musculoskeletal: Negative for back pain. Skin: Negative for rash. Neurological: Negative for headaches, focal weakness or numbness. Endocrine:Hyperlipidemia, hypertension, prediabetes. Allergic/Immunilogical: Penicillin ____________________________________________   PHYSICAL EXAM:  VITAL SIGNS: ED Triage Vitals  Enc Vitals Group     BP 01/02/18 0855 (!) 144/87     Pulse Rate 01/02/18 0855 61     Resp 01/02/18 0855 18     Temp 01/02/18 0855 98.2 F (36.8 C)     Temp Source 01/02/18  0855 Oral     SpO2 01/02/18 0855 98 %     Weight 01/02/18 0856 200 lb (90.7 kg)     Height 01/02/18 0856 5\' 8"  (1.727 m)     Head Circumference --      Peak Flow --      Pain Score 01/02/18 0856 7     Pain Loc --      Pain Edu? --      Excl. in [place]? --    Constitutional: Alert and oriented. Well appearing and in no acute distress. Cardiovascular: Normal rate, regular rhythm. Grossly normal heart sounds.  Good peripheral circulation. Respiratory: Normal respiratory effort.  No retractions. Lungs CTAB. Musculoskeletal: Right lower extremity tenderness in the right popliteal area and calf.  No joint effusion.   Neurologic:  Normal speech and language. No gross focal neurologic deficits are appreciated. No gait instability. Skin:  Skin is warm, dry and intact. No rash noted. Psychiatric: Mood and affect are normal. Speech and behavior are normal.  ____________________________________________   LABS (all labs ordered are listed, but only abnormal results are displayed)  Labs Reviewed - No data to display ____________________________________________  EKG   ____________________________________________  RADIOLOGY  ED MD interpretation:    Official radiology report(s): US Venous Img Lower Unilateral Right  Result Date: 01/02/2018 CLINICAL DATA:  52 year old male with progressive right calf pain for the past week. EXAM: RIGHT LOWER EXTREMITY VENOUS DOPPLER ULTRASOUND TECHNIQUE: Gray-scale sonography with graded compression, as well as color Doppler and duplex ultrasound were performed to evaluate the lower extremity deep venous systems from the level of the common femoral vein and including the common femoral, femoral, profunda femoral, popliteal and calf veins including the posterior tibial, peroneal and gastrocnemius veins when visible. The superficial great saphenous vein was also interrogated. Spectral Doppler was utilized to evaluate flow at rest and with distal augmentation  maneuvers in the common femoral, femoral and popliteal veins. COMPARISON:  None. FINDINGS: Contralateral Common Femoral Vein: Respiratory phasicity is normal and symmetric with the symptomatic side. No evidence of thrombus. Normal compressibility. Common Femoral Vein: No evidence of thrombus. Normal compressibility, respiratory phasicity and response to augmentation. Saphenofemoral Junction: No evidence of thrombus. Normal compressibility and flow on color Doppler imaging. Profunda Femoral Vein: No evidence of thrombus. Normal compressibility and flow on color Doppler imaging. Femoral Vein: No evidence of thrombus. Normal compressibility, respiratory phasicity and response to augmentation. Popliteal Vein: No evidence of thrombus. Normal compressibility, respiratory phasicity and response to augmentation. Calf Veins: No evidence of thrombus. Normal compressibility and flow on color Doppler imaging. Superficial Great Saphenous Vein: No evidence of thrombus. Normal compressibility. Venous Reflux:  None. Other Findings:  None. IMPRESSION: No evidence of deep venous thrombosis. Electronically Signed   By: Jacqulynn Cadet M.D.   On: 01/02/2018 11:49   Dg Knee Complete 4 Views Right  Result Date: 01/02/2018 CLINICAL DATA:  Posterior right knee pain.  No  reported injury. EXAM: RIGHT KNEE - COMPLETE 4+ VIEW COMPARISON:  None. FINDINGS: Small suprapatellar right knee joint effusion. No fracture or dislocation. No suspicious focal osseous lesion. No significant arthropathy. No radiopaque foreign body. IMPRESSION: Small suprapatellar right knee joint effusion. No osseous abnormality. Electronically Signed   By: Ilona Sorrel M.D.   On: 01/02/2018 10:12    ____________________________________________   PROCEDURES  Procedure(s) performed: None  Procedures  Critical Care performed: No  ____________________________________________   INITIAL IMPRESSION / ASSESSMENT AND PLAN / ED COURSE  As part of my medical  decision making, I reviewed the following data within the electronic MEDICAL RECORD NUMBER    Right knee and knee pain secondary to strain.  Discussed x-ray and ultrasound reports with patient.  Patient given discharge care instruction.  Patient given a work note and advised follow-up PCP for continued care.      ____________________________________________   FINAL CLINICAL IMPRESSION(S) / ED DIAGNOSES  Final diagnoses:  Effusion of right knee  Acute pain of right knee     ED Discharge Orders         Ordered    traMADol (ULTRAM) 50 MG tablet  Every 12 hours PRN     01/02/18 1244    naproxen (NAPROSYN) 500 MG tablet  2 times daily with meals     01/02/18 1244           Note:  This document was prepared using Dragon voice recognition software and may include unintentional dictation errors.    Sable Feil, PA-C 01/02/18 1251    Nance Pear, MD 01/02/18 1254

## 2018-01-03 NOTE — Progress Notes (Signed)
Dr. Frederico Hamman T. Daronte Shostak, MD, [facility] [place], [address] Phone: [phone] Fax: [phone]  01/04/2018  Patient: Thomas Blair, MRN: [id number], DOB: 1965-07-03, 52 y.o.  Primary Physician:  Owens Loffler, MD   Chief Complaint  Patient presents with  . Follow-up    [facility] ED-Right Knee Pain/Effusion   Subjective:   Devrin Viona Gilmore Pote is a 52 y.o. very pleasant male patient who presents with the following:  Patient is seen in ER follow-up for knee pain, ER visit January 02, 2018.  Given Naprosyn. XR grossly WNL.  Had been doing some yard work and heard a pop at work - heard a pop and then went down. Trouble going up and down stairs. Getting in and out of a car is OK.   The patient has a moderate effusion.  He has a mild limp.  He has some fullness and pressure throughout the knee and posteriorly.  He has not had any symptomatic giving way.  No locking up of the joint.  Prior to this, he has not had any significant knee injury, fractures, or operations.  r knee asp and inj  Past Medical History, Surgical History, Social History, Family History, Problem List, Medications, and Allergies have been reviewed and updated if relevant.  Patient Active Problem List   Diagnosis Date Noted  . Obesity (BMI 30-39.9) 04/30/2013  . Prediabetes 03/20/2013  . PROSTATITIS, ACUTE, CHRONIC 05/07/2009  . ORGANIC IMPOTENCE 05/07/2009  . Hyperlipidemia LDL goal <100 02/05/2009  . HYPERTENSION 01/01/2009    Past Medical History:  Diagnosis Date  . Allergy   . GERD (gastroesophageal reflux disease)   . Hernia   . History of chicken pox   . Hyperlipidemia   . Hypertension   . Prediabetes 03/20/2013    Past Surgical History:  Procedure Laterality Date  . HERNIA REPAIR      Social History   Socioeconomic History  . Marital status: Married    Spouse name: Not on file  . Number of children: Not on file  . Years of  education: Not on file  . Highest education level: Not on file  Occupational History  . Occupation: [employer]  Social Needs  . Financial resource strain: Not on file  . Food insecurity:    Worry: Not on file    Inability: Not on file  . Transportation needs:    Medical: Not on file    Non-medical: Not on file  Tobacco Use  . Smoking status: Never Smoker  . Smokeless tobacco: Never Used  Substance and Sexual Activity  . Alcohol use: Yes    Alcohol/week: 6.0 standard drinks    Types: 6 Standard drinks or equivalent per week    Comment: weekly  . Drug use: No  . Sexual activity: Not on file  Lifestyle  . Physical activity:    Days per week: Not on file    Minutes per session: Not on file  . Stress: Not on file  Relationships  . Social connections:    Talks on phone: Not on file    Gets together: Not on file    Attends religious service: Not on file    Active member of club or organization: Not on file    Attends meetings of clubs or organizations: Not on file    Relationship status: Not on file  . Intimate partner violence:    Fear of current or ex partner: Not on  file    Emotionally abused: Not on file    Physically abused: Not on file    Forced sexual activity: Not on file  Other Topics Concern  . Not on file  Social History Narrative   Exercise yes    Family History  Problem Relation Age of Onset  . Colon cancer Neg Hx   . Colon polyps Neg Hx   . Esophageal cancer Neg Hx   . Rectal cancer Neg Hx   . Stomach cancer Neg Hx     Allergies  Allergen Reactions  . Penicillins     REACTION: rash    Medication list reviewed and updated in full in [place].  GEN: No fevers, chills. Nontoxic. Primarily MSK c/o today. MSK: Detailed in the HPI GI: tolerating PO intake without difficulty Neuro: No numbness, parasthesias, or tingling associated. Otherwise the pertinent positives of the ROS are noted above.   Objective:   BP 120/80   Pulse (!) 59    Temp 98.7 F (37.1 C) (Oral)   Ht 5' 6.5" (1.689 m)   Wt 214 lb 8 oz (97.3 kg)   BMI 34.10 kg/m    GEN: WDWN, NAD, Non-toxic, Alert & Oriented x 3 HEENT: Atraumatic, Normocephalic.  Ears and Nose: No external deformity. EXTR: No clubbing/cyanosis/edema NEURO: Normal gait.  PSYCH: Normally interactive. Conversant. Not depressed or anxious appearing.  Calm demeanor.   Knee:  R Gait: Normal heel toe pattern ROM: 0-120 Effusion: mod Echymosis or edema: none Patellar tendon NT Painful PLICA: neg Patellar grind: negative Medial and lateral patellar facet loading: negative medial and lateral joint lines: mild Mcmurray's neg Flexion-pinch pain Varus and valgus stress: stable Lachman: neg Ant and Post drawer: neg Hip abduction, IR, ER: WNL Hip flexion str: 5/5 Hip abd: 5/5 Quad: 5/5 VMO atrophy:No Hamstring concentric and eccentric: 5/5   Radiology: US Venous Img Lower Unilateral Right  Result Date: 01/02/2018 CLINICAL DATA:  52 year old male with progressive right calf pain for the past week. EXAM: RIGHT LOWER EXTREMITY VENOUS DOPPLER ULTRASOUND TECHNIQUE: Gray-scale sonography with graded compression, as well as color Doppler and duplex ultrasound were performed to evaluate the lower extremity deep venous systems from the level of the common femoral vein and including the common femoral, femoral, profunda femoral, popliteal and calf veins including the posterior tibial, peroneal and gastrocnemius veins when visible. The superficial great saphenous vein was also interrogated. Spectral Doppler was utilized to evaluate flow at rest and with distal augmentation maneuvers in the common femoral, femoral and popliteal veins. COMPARISON:  None. FINDINGS: Contralateral Common Femoral Vein: Respiratory phasicity is normal and symmetric with the symptomatic side. No evidence of thrombus. Normal compressibility. Common Femoral Vein: No evidence of thrombus. Normal compressibility, respiratory  phasicity and response to augmentation. Saphenofemoral Junction: No evidence of thrombus. Normal compressibility and flow on color Doppler imaging. Profunda Femoral Vein: No evidence of thrombus. Normal compressibility and flow on color Doppler imaging. Femoral Vein: No evidence of thrombus. Normal compressibility, respiratory phasicity and response to augmentation. Popliteal Vein: No evidence of thrombus. Normal compressibility, respiratory phasicity and response to augmentation. Calf Veins: No evidence of thrombus. Normal compressibility and flow on color Doppler imaging. Superficial Great Saphenous Vein: No evidence of thrombus. Normal compressibility. Venous Reflux:  None. Other Findings:  None. IMPRESSION: No evidence of deep venous thrombosis. Electronically Signed   By: Jacqulynn Cadet M.D.   On: 01/02/2018 11:49   Dg Knee Complete 4 Views Right  Result Date: 01/02/2018 CLINICAL DATA:  Posterior right knee pain.  No reported injury. EXAM: RIGHT KNEE - COMPLETE 4+ VIEW COMPARISON:  None. FINDINGS: Small suprapatellar right knee joint effusion. No fracture or dislocation. No suspicious focal osseous lesion. No significant arthropathy. No radiopaque foreign body. IMPRESSION: Small suprapatellar right knee joint effusion. No osseous abnormality. Electronically Signed   By: Ilona Sorrel M.D.   On: 01/02/2018 10:12    Assessment and Plan:   Acute internal derangement of right knee - Plan: methylPREDNISolone acetate (DEPO-MEDROL) injection 80 mg  Effusion of right knee joint - Plan: methylPREDNISolone acetate (DEPO-MEDROL) injection 80 mg  Meniscal injury most likely in the differential.  For now, and have him use a patellar J brace for work, and we are going to aspirate his knee and continue to use some ice and NSAIDs.  Knee Aspiration and Injection, R Patient verbally consented; risks, benefits, and alternatives explained including possible infection. Patient prepped with Chloraprep. Ethyl  chloride for anesthesia. 10 cc of 1% Lidocaine used in wheal then injected Subcutaneous fashion with 22 gauge needle on lateral approach. Under sterilne conditions, 18 gauge needle used via lateral approach to aspirate 40 cc of serosanguinous fluid. Then 8 cc of Lidocaine 1% and 2 mL of Depo-Medrol 40 mg injected. Tolerated well, decreased pain, no complications.   Follow-up: Return in about 1 month (around 02/04/2018) for knee recheck.  Meds ordered this encounter  Medications  . methylPREDNISolone acetate (DEPO-MEDROL) injection 80 mg   Signed,  Orville Mena T. Lycia Sachdeva, MD   Allergies as of 01/04/2018      Reactions   Penicillins    REACTION: rash      Medication List        Accurate as of 01/04/18  1:47 PM. Always use your most recent med list.          alfuzosin 10 MG 24 hr tablet Commonly known as:  UROXATRAL Take 10 mg by mouth at bedtime.   amLODipine 10 MG tablet Commonly known as:  NORVASC Take 1 tablet (10 mg total) by mouth daily.   ibuprofen 200 MG tablet Commonly known as:  ADVIL,MOTRIN Take 200 mg by mouth every 6 (six) hours as needed. Reported on 10/29/2015   metoprolol tartrate 25 MG tablet Commonly known as:  LOPRESSOR Take 1 tablet (25 mg total) by mouth 2 (two) times daily.   multivitamin tablet Take 1 tablet by mouth daily. Alive Men's Energy   naproxen 500 MG tablet Commonly known as:  NAPROSYN Take 1 tablet (500 mg total) by mouth 2 (two) times daily with a meal.   sildenafil 20 MG tablet Commonly known as:  REVATIO TAKE 2 TO 5 TABLETS 30 MIN PRIOR TO INTERCOURSE   traMADol 50 MG tablet Commonly known as:  ULTRAM Take 1 tablet (50 mg total) by mouth every 12 (twelve) hours as needed.   triamterene-hydrochlorothiazide 37.5-25 MG tablet Commonly known as:  MAXZIDE-25 Take 1 tablet by mouth daily.

## 2018-01-04 ENCOUNTER — Ambulatory Visit: Payer: BLUE CROSS/BLUE SHIELD | Admitting: Family Medicine

## 2018-01-04 ENCOUNTER — Encounter: Payer: Self-pay | Admitting: Family Medicine

## 2018-01-04 VITALS — BP 120/80 | HR 59 | Temp 98.7°F | Ht 66.5 in | Wt 214.5 lb

## 2018-01-04 DIAGNOSIS — M25461 Effusion, right knee: Secondary | ICD-10-CM

## 2018-01-04 DIAGNOSIS — M2391 Unspecified internal derangement of right knee: Secondary | ICD-10-CM

## 2018-01-04 MED ORDER — METHYLPREDNISOLONE ACETATE 40 MG/ML IJ SUSP
80.0000 mg | Freq: Once | INTRAMUSCULAR | Status: AC
  Administered 2018-01-04: 80 mg via INTRA_ARTICULAR

## 2018-01-30 ENCOUNTER — Ambulatory Visit: Payer: BLUE CROSS/BLUE SHIELD | Admitting: Family Medicine

## 2018-01-30 ENCOUNTER — Encounter: Payer: Self-pay | Admitting: Family Medicine

## 2018-01-30 VITALS — BP 120/80 | HR 73 | Temp 98.7°F | Ht 66.5 in | Wt 216.2 lb

## 2018-01-30 DIAGNOSIS — K111 Hypertrophy of salivary gland: Secondary | ICD-10-CM

## 2018-01-30 DIAGNOSIS — M25561 Pain in right knee: Secondary | ICD-10-CM | POA: Diagnosis not present

## 2018-01-30 DIAGNOSIS — M25461 Effusion, right knee: Secondary | ICD-10-CM | POA: Diagnosis not present

## 2018-01-30 MED ORDER — CLINDAMYCIN HCL 300 MG PO CAPS: 300.0000 mg | ORAL_CAPSULE | Freq: Three times a day (TID) | ORAL | 0 refills | Status: DC

## 2018-01-30 NOTE — Patient Instructions (Signed)
REFERRALS TO SPECIALISTS, SPECIAL TESTS (MRI, CT, ULTRASOUNDS)  MARION or  Anastasiya will help you. ASK CHECK-IN FOR HELP.  Specialist appointment times vary a great deal, based on their schedule / openings. -- Some specialists have very long wait times. (Example. Dermatology)    

## 2018-01-30 NOTE — Progress Notes (Signed)
Dr. Frederico Hamman T. Ace Bergfeld, MD, [facility] and Sports Medicine [place], [address] Phone: [phone] Fax: [phone]  01/30/2018  Patient: Thomas Blair, MRN: [id number], DOB: 07/19/1965, 52 y.o.  Primary Physician:  Owens Loffler, MD   Chief Complaint  Patient presents with  . Knee Pain    Right   Subjective:   Thomas Blair is a 52 y.o. very pleasant male patient who presents with the following:  F/u R knee pain:  Seen 01/04/2018. Asp and injected with f/u.  He is here in follow-up, and he is accompanied by his wife today.  He has done poorly since I saw him additionally.  Initially for about 7 to 10 days, his knee felt quite a bit better after I aspirated it, but then he has had persistently worsening pain, primarily in the medial aspect of the knee, but also in the anterior aspect of the knee.  He has a large effusion currently, and is having a great deal of difficulty bending his knee.  He is having trouble walking at work, and having trouble doing of his task oriented jobs at work, where he is bending at the knee and rotating at the knee.  He also has some swelling on the right side at the corner of his jaw that is been present for the last few days.  Is more tender to touch at this area.  R parotid gland  Past Medical History, Surgical History, Social History, Family History, Problem List, Medications, and Allergies have been reviewed and updated if relevant.  Patient Active Problem List   Diagnosis Date Noted  . Obesity (BMI 30-39.9) 04/30/2013  . Prediabetes 03/20/2013  . PROSTATITIS, ACUTE, CHRONIC 05/07/2009  . ORGANIC IMPOTENCE 05/07/2009  . Hyperlipidemia LDL goal <100 02/05/2009  . HYPERTENSION 01/01/2009    Past Medical History:  Diagnosis Date  . Allergy   . GERD (gastroesophageal reflux disease)   . Hernia   . History of chicken pox   . Hyperlipidemia   . Hypertension   . Prediabetes 03/20/2013     Past Surgical History:  Procedure Laterality Date  . HERNIA REPAIR      Social History   Socioeconomic History  . Marital status: Married    Spouse name: Not on file  . Number of children: Not on file  . Years of education: Not on file  . Highest education level: Not on file  Occupational History  . Occupation: [employer]  Social Needs  . Financial resource strain: Not on file  . Food insecurity:    Worry: Not on file    Inability: Not on file  . Transportation needs:    Medical: Not on file    Non-medical: Not on file  Tobacco Use  . Smoking status: Never Smoker  . Smokeless tobacco: Never Used  Substance and Sexual Activity  . Alcohol use: Yes    Alcohol/week: 6.0 standard drinks    Types: 6 Standard drinks or equivalent per week    Comment: weekly  . Drug use: No  . Sexual activity: Not on file  Lifestyle  . Physical activity:    Days per week: Not on file    Minutes per session: Not on file  . Stress: Not on file  Relationships  . Social connections:    Talks on phone: Not on file    Gets together: Not on file    Attends religious service: Not on file  Active member of club or organization: Not on file    Attends meetings of clubs or organizations: Not on file    Relationship status: Not on file  . Intimate partner violence:    Fear of current or ex partner: Not on file    Emotionally abused: Not on file    Physically abused: Not on file    Forced sexual activity: Not on file  Other Topics Concern  . Not on file  Social History Narrative   Exercise yes    Family History  Problem Relation Age of Onset  . Colon cancer Neg Hx   . Colon polyps Neg Hx   . Esophageal cancer Neg Hx   . Rectal cancer Neg Hx   . Stomach cancer Neg Hx     Allergies  Allergen Reactions  . Penicillins     REACTION: rash    Medication list reviewed and updated in full in [facility].  GEN: No fevers, chills. Nontoxic. Primarily MSK c/o today. MSK:  Detailed in the HPI GI: tolerating PO intake without difficulty Neuro: No numbness, parasthesias, or tingling associated. Otherwise the pertinent positives of the ROS are noted above.   Objective:   BP 120/80   Pulse 73   Temp 98.7 F (37.1 C) (Oral)   Ht 5' 6.5" (1.689 m)   Wt 216 lb 4 oz (98.1 kg)   BMI 34.38 kg/m    GEN: WDWN, NAD, Non-toxic, Alert & Oriented x 3 HEENT: Atraumatic, Normocephalic.  Ears and Nose: No external deformity. EXTR: No clubbing/cyanosis/edema NEURO: Normal gait.  PSYCH: Normally interactive. Conversant. Not depressed or anxious appearing.  Calm demeanor.    Right knee: Large ballotable effusion.  Full extension.  Flexion to 93 degrees.  Notable tenderness on the medial joint line and to a lesser extent lateral joint line.  Stable to varus and valgus stress.  Unclear definitive endpoint at [place].  Patient has market tenderness with flexion as well as with McMurray's.  Bounce home test also causes pain.  Radiology: US Venous Img Lower Unilateral Right  Result Date: 01/02/2018 CLINICAL DATA:  52 year old male with progressive right calf pain for the past week. EXAM: RIGHT LOWER EXTREMITY VENOUS DOPPLER ULTRASOUND TECHNIQUE: Gray-scale sonography with graded compression, as well as color Doppler and duplex ultrasound were performed to evaluate the lower extremity deep venous systems from the level of the common femoral vein and including the common femoral, femoral, profunda femoral, popliteal and calf veins including the posterior tibial, peroneal and gastrocnemius veins when visible. The superficial great saphenous vein was also interrogated. Spectral Doppler was utilized to evaluate flow at rest and with distal augmentation maneuvers in the common femoral, femoral and popliteal veins. COMPARISON:  None. FINDINGS: Contralateral Common Femoral Vein: Respiratory phasicity is normal and symmetric with the symptomatic side. No evidence of thrombus. Normal  compressibility. Common Femoral Vein: No evidence of thrombus. Normal compressibility, respiratory phasicity and response to augmentation. Saphenofemoral Junction: No evidence of thrombus. Normal compressibility and flow on color Doppler imaging. Profunda Femoral Vein: No evidence of thrombus. Normal compressibility and flow on color Doppler imaging. Femoral Vein: No evidence of thrombus. Normal compressibility, respiratory phasicity and response to augmentation. Popliteal Vein: No evidence of thrombus. Normal compressibility, respiratory phasicity and response to augmentation. Calf Veins: No evidence of thrombus. Normal compressibility and flow on color Doppler imaging. Superficial Great Saphenous Vein: No evidence of thrombus. Normal compressibility. Venous Reflux:  None. Other Findings:  None. IMPRESSION: No evidence of deep venous  thrombosis. Electronically Signed   By: Jacqulynn Cadet M.D.   On: 01/02/2018 11:49   Dg Knee Complete 4 Views Right  Result Date: 01/02/2018 CLINICAL DATA:  Posterior right knee pain.  No reported injury. EXAM: RIGHT KNEE - COMPLETE 4+ VIEW COMPARISON:  None. FINDINGS: Small suprapatellar right knee joint effusion. No fracture or dislocation. No suspicious focal osseous lesion. No significant arthropathy. No radiopaque foreign body. IMPRESSION: Small suprapatellar right knee joint effusion. No osseous abnormality. Electronically Signed   By: Ilona Sorrel M.D.   On: 01/02/2018 10:12    Assessment and Plan:   Acute pain of right knee - Plan: MR Knee Right Wo Contrast  Effusion of right knee joint  Parotid gland enlargement  The patient is doing poorly and worsening despite conservative management for 1 month including bracing, ice, NSAIDs, altered activities, aspiration and injection with corticosteroid, and he is doing functionally poorly.  Obtain an MRI of the right knee to evaluate for the integrity of the patient's anterior cruciate ligament, as well as for other  internal derangement including meniscal tear.  Further definitive action will depend upon MRI findings.  I did place the patient on some work restrictions, and these are detailed in his letter in our electronic health record.  Parotid gland enlargement, concern for possible infection, stone also possible, eat hard candy and ABX.  Meds ordered this encounter  Medications  . clindamycin (CLEOCIN) 300 MG capsule    Sig: Take 1 capsule (300 mg total) by mouth 3 (three) times daily.    Dispense:  30 capsule    Refill:  0   Orders Placed This Encounter  Procedures  . MR Knee Right Wo Contrast    Signed,  Sosha Shepherd T. Nima Kemppainen, MD   Allergies as of 01/30/2018      Reactions   Penicillins    REACTION: rash      Medication List        Accurate as of 01/30/18 11:59 PM. Always use your most recent med list.          alfuzosin 10 MG 24 hr tablet Commonly known as:  UROXATRAL Take 10 mg by mouth at bedtime.   amLODipine 10 MG tablet Commonly known as:  NORVASC Take 1 tablet (10 mg total) by mouth daily.   clindamycin 300 MG capsule Commonly known as:  CLEOCIN Take 1 capsule (300 mg total) by mouth 3 (three) times daily.   ibuprofen 200 MG tablet Commonly known as:  ADVIL,MOTRIN Take 200 mg by mouth every 6 (six) hours as needed. Reported on 10/29/2015   metoprolol tartrate 25 MG tablet Commonly known as:  LOPRESSOR Take 1 tablet (25 mg total) by mouth 2 (two) times daily.   multivitamin tablet Take 1 tablet by mouth daily. Alive Men's Energy   naproxen 500 MG tablet Commonly known as:  NAPROSYN Take 1 tablet (500 mg total) by mouth 2 (two) times daily with a meal.   sildenafil 20 MG tablet Commonly known as:  REVATIO TAKE 2 TO 5 TABLETS 30 MIN PRIOR TO INTERCOURSE   traMADol 50 MG tablet Commonly known as:  ULTRAM Take 1 tablet (50 mg total) by mouth every 12 (twelve) hours as needed.   triamterene-hydrochlorothiazide 37.5-25 MG tablet Commonly known as:   MAXZIDE-25 Take 1 tablet by mouth daily.

## 2018-01-31 ENCOUNTER — Encounter: Payer: Self-pay | Admitting: Family Medicine

## 2018-02-01 ENCOUNTER — Ambulatory Visit: Payer: BLUE CROSS/BLUE SHIELD | Admitting: Family Medicine

## 2018-02-05 ENCOUNTER — Ambulatory Visit
Admission: RE | Admit: 2018-02-05 | Discharge: 2018-02-05 | Disposition: A | Discharge status: Home / Self Care | Payer: BLUE CROSS/BLUE SHIELD | Source: Ambulatory Visit | Attending: Family Medicine | Admitting: Family Medicine

## 2018-02-05 DIAGNOSIS — M25561 Pain in right knee: Secondary | ICD-10-CM

## 2018-02-06 ENCOUNTER — Telehealth: Payer: Self-pay

## 2018-02-06 ENCOUNTER — Other Ambulatory Visit: Payer: Self-pay | Admitting: Family Medicine

## 2018-02-06 DIAGNOSIS — S83241A Other tear of medial meniscus, current injury, right knee, initial encounter: Secondary | ICD-10-CM

## 2018-02-06 DIAGNOSIS — M238X1 Other internal derangements of right knee: Secondary | ICD-10-CM

## 2018-02-06 DIAGNOSIS — M25561 Pain in right knee: Secondary | ICD-10-CM

## 2018-02-06 NOTE — Telephone Encounter (Signed)
Spoke with the patients wife because the call got dropped, patient is scheduled and Appt given to wife.

## 2018-02-06 NOTE — Telephone Encounter (Signed)
Copied from [place] [id number]. Topic: General - Other >> Feb 06, 2018 11:01 AM Yvette Rack wrote: Reason for CRM: Pt returned call to office. Pt requests call back.

## 2018-07-16 NOTE — Progress Notes (Signed)
Dr. Frederico Hamman T. Laithan Conchas, MD, [facility] Primary [facility] and Sports Medicine [place], [address] Phone: [phone] Fax: [phone]  07/19/2018  Patient: Thomas Blair, MRN: [id number], DOB: 05-19-1965, 53 y.o.  Primary Physician:  Owens Loffler, MD   Chief Complaint  Patient presents with  . Annual Exam   Subjective:   Thomas Blair is a 53 y.o. pleasant patient who presents with the following:  Preventative Health Maintenance Visit:  Health Maintenance Summary Reviewed and updated, unless pt declines services.  Tobacco History Reviewed. Alcohol: No concerns, no excessive use Exercise Habits: Some activity, rec at least 30 mins 5 times a week STD concerns: no risk or activity to increase risk Drug Use: None Encouraged self-testicular check  Out of network.  PSA is high again  Was taking some bladder relaxer    Health Maintenance  Topic Date Due  . HIV Screening  04/08/1979  . INFLUENZA VACCINE  08/08/2018 (Originally 12/08/2017)  . COLONOSCOPY  10/29/2020  . TETANUS/TDAP  07/09/2024  . Hepatitis C Screening  Completed   Immunization History  Administered Date(s) Administered  . Influenza Split 02/15/2011, 03/06/2012  . Influenza Whole 02/05/2009  . Influenza, Seasonal, Injecte, Preservative Fre 03/11/2014  . Influenza,inj,Quad PF,6+ Mos 03/19/2013, 06/09/2016  . Tdap 07/10/2014   Patient Active Problem List   Diagnosis Date Noted  . Obesity (BMI 30-39.9) 04/30/2013  . Prediabetes 03/20/2013  . PROSTATITIS, ACUTE, CHRONIC 05/07/2009  . ORGANIC IMPOTENCE 05/07/2009  . Hyperlipidemia LDL goal <100 02/05/2009  . HYPERTENSION 01/01/2009   Past Medical History:  Diagnosis Date  . Allergy   . GERD (gastroesophageal reflux disease)   . Hernia   . History of chicken pox   . Hyperlipidemia   . Hypertension   . Prediabetes 03/20/2013   Past Surgical History:  Procedure Laterality Date  . HERNIA REPAIR     Social  History   Socioeconomic History  . Marital status: Married    Spouse name: Not on file  . Number of children: Not on file  . Years of education: Not on file  . Highest education level: Not on file  Occupational History  . Occupation: [employer]  Social Needs  . Financial resource strain: Not on file  . Food insecurity:    Worry: Not on file    Inability: Not on file  . Transportation needs:    Medical: Not on file    Non-medical: Not on file  Tobacco Use  . Smoking status: Never Smoker  . Smokeless tobacco: Never Used  Substance and Sexual Activity  . Alcohol use: Yes    Alcohol/week: 6.0 standard drinks    Types: 6 Standard drinks or equivalent per week    Comment: weekly  . Drug use: No  . Sexual activity: Not on file  Lifestyle  . Physical activity:    Days per week: Not on file    Minutes per session: Not on file  . Stress: Not on file  Relationships  . Social connections:    Talks on phone: Not on file    Gets together: Not on file    Attends religious service: Not on file    Active member of club or organization: Not on file    Attends meetings of clubs or organizations: Not on file    Relationship status: Not on file  . Intimate partner violence:    Fear of current or ex partner: Not on file    Emotionally  abused: Not on file    Physically abused: Not on file    Forced sexual activity: Not on file  Other Topics Concern  . Not on file  Social History Narrative   Exercise yes   Family History  Problem Relation Age of Onset  . Colon cancer Neg Hx   . Colon polyps Neg Hx   . Esophageal cancer Neg Hx   . Rectal cancer Neg Hx   . Stomach cancer Neg Hx    Allergies  Allergen Reactions  . Penicillins     REACTION: rash    Medication list has been reviewed and updated.   General: Denies fever, chills, sweats. No significant weight loss. Eyes: Denies blurring,significant itching ENT: Denies earache, sore throat, and hoarseness. Cardiovascular:  Denies chest pains, palpitations, dyspnea on exertion Respiratory: Denies cough, dyspnea at rest,wheeezing Breast: no concerns about lumps GI: Denies nausea, vomiting, diarrhea, constipation, change in bowel habits, abdominal pain, melena, hematochezia GU: Denies penile discharge, ED, urinary flow / outflow problems. No STD concerns. Musculoskeletal: Denies back pain, joint pain Derm: Denies rash, itching Neuro: Denies  paresthesias, frequent falls, frequent headaches Psych: Denies depression, anxiety Endocrine: Denies cold intolerance, heat intolerance, polydipsia Heme: Denies enlarged lymph nodes Allergy: No hayfever  Objective:   BP 120/70   Pulse 60   Temp 98.4 F (36.9 C) (Oral)   Ht 5' 6.25" (1.683 m)   Wt 214 lb (97.1 kg)   BMI 34.28 kg/m  Ideal Body Weight: Weight in (lb) to have BMI = 25: 155.7  No exam data present  GEN: well developed, well nourished, no acute distress Eyes: conjunctiva and lids normal, PERRLA, EOMI ENT: TM clear, nares clear, oral exam WNL Neck: supple, no lymphadenopathy, no thyromegaly, no JVD Pulm: clear to auscultation and percussion, respiratory effort normal CV: regular rate and rhythm, S1-S2, no murmur, rub or gallop, no bruits, peripheral pulses normal and symmetric, no cyanosis, clubbing, edema or varicosities GI: soft, non-tender; no hepatosplenomegaly, masses; active bowel sounds all quadrants GU: no hernia, testicular mass, penile discharge Lymph: no cervical, axillary or inguinal adenopathy MSK: gait normal, muscle tone and strength WNL, no joint swelling, effusions, discoloration, crepitus  SKIN: clear, good turgor, color WNL, no rashes, lesions, or ulcerations Neuro: normal mental status, normal strength, sensation, and motion Psych: alert; oriented to person, place and time, normally interactive and not anxious or depressed in appearance. All labs reviewed with patient.  Lipids: Lab Results  Component Value Date   CHOL 174  07/17/2018   Lab Results  Component Value Date   HDL 40.30 07/17/2018   Lab Results  Component Value Date   LDLCALC 113 (H) 07/17/2018   Lab Results  Component Value Date   TRIG 104.0 07/17/2018   Lab Results  Component Value Date   CHOLHDL 4 07/17/2018   CBC: CBC Latest Ref Rng & Units 07/17/2018 07/15/2017 06/09/2016  WBC 4.0 - 10.5 K/uL 7.1 5.0 6.9  Hemoglobin 13.0 - 17.0 g/dL 15.8 15.6 16.3  Hematocrit 39.0 - 52.0 % 45.2 44.9 47.2  Platelets 150.0 - 400.0 K/uL 190.0 188.0 187.0    Basic Metabolic Panel:    Component Value Date/Time   NA 139 07/17/2018 0744   K 3.5 07/17/2018 0744   CL 101 07/17/2018 0744   CO2 30 07/17/2018 0744   BUN 19 07/17/2018 0744   CREATININE 1.00 07/17/2018 0744   GLUCOSE 124 (H) 07/17/2018 0744   CALCIUM 9.7 07/17/2018 0744   Hepatic Function Latest Ref Rng &  Units 07/17/2018 07/15/2017 06/09/2016  Total Protein 6.0 - 8.3 g/dL 7.3 7.2 7.1  Albumin 3.5 - 5.2 g/dL 4.7 4.3 4.6  AST 0 - 37 U/L 13 11 14   ALT 0 - 53 U/L 16 13 21   Alk Phosphatase 39 - 117 U/L 82 61 72  Total Bilirubin 0.2 - 1.2 mg/dL 0.8 0.7 1.1  Bilirubin, Direct 0.0 - 0.3 mg/dL 0.2 0.2 0.2    Lab Results  Component Value Date   HGBA1C 5.9 07/17/2018   No results found for: TSH Lab Results  Component Value Date   PSA 5.12 (H) 06/09/2016   PSA 4.34 (H) 07/09/2015   PSA 7.48 (H) 08/21/2014   Status:  Final result  Visible to patient:  No (Not Released)  Next appt:  07/18/2019 at 07:30 AM in Family Medicine ([facility])  Dx:  Healthcare maintenance   Ref Range & Units 2d ago 57yrago 228yrgo  PSA, Total < OR = 4.0 ng/mL 9.3High   11.0High   7.0High  R  PSA, Free ng/mL 1.3  1.9  2.8 R  PSA, % Free >25 % (calc) 14Low   NOT CALCULATED CM 40 R, CM  Comment: .  PSA(ng/mL)   Free PSA(%)   Estimated(x) Probability                    of Cancer(as%)  0-2.5       (*)        Approx. 1  2.6-4.0(1)     0-27(2)          24(3)    4.1-10(4)     0-10           56           11-15           28           16-20           20           21-25           16           >or =26          8  >10(+)       N/A           >50          Assessment and Plan:   Healthcare maintenance - Plan: Basic metabolic panel, CBC with Differential/Platelet, Hepatic function panel, Hemoglobin A1c, Lipid panel, PSA, total and free, Hepatitis C antibody  Encounter for hepatitis C virus screening test for high risk patient - Plan: Hepatitis C antibody  Elevated PSA - Plan: Ambulatory referral to Urology  Total PSA is 9.3 with a Free PSA % of 14 with a statistical risk of having prostate cancer at 28%.  He needs longitudinal Urological follow-up.  He has seen Urology in the past but missed his most recent follow-up appointments.   Work on [employer] Maintenance Exam: The patient's preventative maintenance and recommended screening tests for an annual wellness exam were reviewed in full today. Brought up to date unless services declined.  Counselled on the importance of diet, exercise, and its role in overall health and mortality. The patient's FH and SH was reviewed, including their home life, tobacco status, and drug and alcohol status.  Follow-up in 1 year for physical exam or additional follow-up below.  Follow-up: No follow-ups on file. Or follow-up in 1 year if not  noted.  Signed,  Thomas Hamman T. Abhiraj Dozal, MD   Allergies as of 07/19/2018      Reactions   Penicillins    REACTION: rash      Medication List       Accurate as of July 19, 2018  9:26 AM. Always use your most recent med list.        alfuzosin 10 MG 24 hr tablet Commonly known as:  UROXATRAL Take 1 tablet (10 mg total) by mouth at bedtime.   amLODipine 10 MG tablet Commonly known as:  NORVASC Take 1 tablet (10 mg total) by mouth daily.   ibuprofen  200 MG tablet Commonly known as:  ADVIL,MOTRIN Take 200 mg by mouth every 6 (six) hours as needed. Reported on 10/29/2015   metoprolol tartrate 25 MG tablet Commonly known as:  LOPRESSOR Take 1 tablet (25 mg total) by mouth 2 (two) times daily.   multivitamin tablet Take 1 tablet by mouth daily. Alive Men's Energy   sildenafil 20 MG tablet Commonly known as:  REVATIO TAKE 2 TO 5 TABLETS 30 MIN PRIOR TO INTERCOURSE   triamterene-hydrochlorothiazide 37.5-25 MG tablet Commonly known as:  MAXZIDE-25 Take 1 tablet by mouth daily.

## 2018-07-17 ENCOUNTER — Other Ambulatory Visit (INDEPENDENT_AMBULATORY_CARE_PROVIDER_SITE_OTHER): Payer: BLUE CROSS/BLUE SHIELD

## 2018-07-17 DIAGNOSIS — Z0000 Encounter for general adult medical examination without abnormal findings: Secondary | ICD-10-CM

## 2018-07-17 LAB — CBC WITH DIFFERENTIAL/PLATELET
BASOS ABS: 0 10*3/uL (ref 0.0–0.1)
Basophils Relative: 0.1 % (ref 0.0–3.0)
EOS ABS: 0.1 10*3/uL (ref 0.0–0.7)
Eosinophils Relative: 2 % (ref 0.0–5.0)
HCT: 45.2 % (ref 39.0–52.0)
Hemoglobin: 15.8 g/dL (ref 13.0–17.0)
LYMPHS ABS: 3.2 10*3/uL (ref 0.7–4.0)
Lymphocytes Relative: 45.2 % (ref 12.0–46.0)
MCHC: 35.1 g/dL (ref 30.0–36.0)
MCV: 83.5 fl (ref 78.0–100.0)
MONO ABS: 0.6 10*3/uL (ref 0.1–1.0)
Monocytes Relative: 8 % (ref 3.0–12.0)
NEUTROS PCT: 44.7 % (ref 43.0–77.0)
Neutro Abs: 3.2 10*3/uL (ref 1.4–7.7)
Platelets: 190 10*3/uL (ref 150.0–400.0)
RBC: 5.41 Mil/uL (ref 4.22–5.81)
RDW: 14 % (ref 11.5–15.5)
WBC: 7.1 10*3/uL (ref 4.0–10.5)

## 2018-07-17 LAB — HEMOGLOBIN A1C: HEMOGLOBIN A1C: 5.9 % (ref 4.6–6.5)

## 2018-07-17 LAB — BASIC METABOLIC PANEL
BUN: 19 mg/dL (ref 6–23)
CALCIUM: 9.7 mg/dL (ref 8.4–10.5)
CO2: 30 mEq/L (ref 19–32)
Chloride: 101 mEq/L (ref 96–112)
Creatinine, Ser: 1 mg/dL (ref 0.40–1.50)
GFR: 77.79 mL/min (ref >60.00)
GLUCOSE: 124 mg/dL — AB (ref 70–99)
Potassium: 3.5 mEq/L (ref 3.5–5.1)
Sodium: 139 mEq/L (ref 135–145)

## 2018-07-17 LAB — LIPID PANEL
Cholesterol: 174 mg/dL (ref 0–200)
HDL: 40.3 mg/dL (ref >39.00)
LDL Cholesterol: 113 mg/dL — ABNORMAL HIGH (ref 0–99)
NONHDL: 133.91
Total CHOL/HDL Ratio: 4
Triglycerides: 104 mg/dL (ref 0.0–149.0)
VLDL: 20.8 mg/dL (ref 0.0–40.0)

## 2018-07-17 LAB — HEPATIC FUNCTION PANEL
ALK PHOS: 82 U/L (ref 39–117)
ALT: 16 U/L (ref 0–53)
AST: 13 U/L (ref 0–37)
Albumin: 4.7 g/dL (ref 3.5–5.2)
BILIRUBIN TOTAL: 0.8 mg/dL (ref 0.2–1.2)
Bilirubin, Direct: 0.2 mg/dL (ref 0.0–0.3)
Total Protein: 7.3 g/dL (ref 6.0–8.3)

## 2018-07-18 LAB — PSA, TOTAL AND FREE
PSA, % FREE: 14 % — AB (ref >25)
PSA, Free: 1.3 ng/mL
PSA, Total: 9.3 ng/mL — ABNORMAL HIGH (ref <=4.0)

## 2018-07-18 LAB — HEPATITIS C ANTIBODY
Hepatitis C Ab: NONREACTIVE
SIGNAL TO CUT-OFF: 0.01 (ref <1.00)

## 2018-07-19 ENCOUNTER — Other Ambulatory Visit: Payer: Self-pay

## 2018-07-19 ENCOUNTER — Ambulatory Visit (INDEPENDENT_AMBULATORY_CARE_PROVIDER_SITE_OTHER): Payer: BLUE CROSS/BLUE SHIELD | Admitting: Family Medicine

## 2018-07-19 ENCOUNTER — Encounter: Payer: Self-pay | Admitting: Family Medicine

## 2018-07-19 VITALS — BP 120/70 | HR 60 | Temp 98.4°F | Ht 66.25 in | Wt 214.0 lb

## 2018-07-19 DIAGNOSIS — R972 Elevated prostate specific antigen [PSA]: Secondary | ICD-10-CM

## 2018-07-19 DIAGNOSIS — Z1159 Encounter for screening for other viral diseases: Secondary | ICD-10-CM | POA: Diagnosis not present

## 2018-07-19 DIAGNOSIS — Z0000 Encounter for general adult medical examination without abnormal findings: Secondary | ICD-10-CM

## 2018-07-19 DIAGNOSIS — Z9189 Other specified personal risk factors, not elsewhere classified: Secondary | ICD-10-CM

## 2018-07-19 MED ORDER — SILDENAFIL CITRATE 20 MG PO TABS: ORAL_TABLET | ORAL | 11 refills | Status: DC

## 2018-07-19 MED ORDER — AMLODIPINE BESYLATE 10 MG PO TABS: 10.0000 mg | ORAL_TABLET | Freq: Every day | ORAL | 3 refills | Status: DC

## 2018-07-19 MED ORDER — ALFUZOSIN HCL ER 10 MG PO TB24: 10.0000 mg | ORAL_TABLET | Freq: Every day | ORAL | 3 refills | Status: DC

## 2018-07-19 MED ORDER — METOPROLOL TARTRATE 25 MG PO TABS: 25.0000 mg | ORAL_TABLET | Freq: Two times a day (BID) | ORAL | 3 refills | Status: DC

## 2019-01-18 ENCOUNTER — Ambulatory Visit (INDEPENDENT_AMBULATORY_CARE_PROVIDER_SITE_OTHER): Payer: BLUE CROSS/BLUE SHIELD | Admitting: Family Medicine

## 2019-01-18 ENCOUNTER — Encounter: Payer: Self-pay | Admitting: Family Medicine

## 2019-01-18 VITALS — BP 120/68 | HR 72 | Temp 99.5°F | Ht 66.25 in | Wt 210.0 lb

## 2019-01-18 DIAGNOSIS — N10 Acute tubulo-interstitial nephritis: Secondary | ICD-10-CM

## 2019-01-18 DIAGNOSIS — R300 Dysuria: Secondary | ICD-10-CM

## 2019-01-18 DIAGNOSIS — A059 Bacterial foodborne intoxication, unspecified: Secondary | ICD-10-CM

## 2019-01-18 LAB — POC URINALSYSI DIPSTICK (AUTOMATED)
Bilirubin, UA: NEGATIVE
Glucose, UA: NEGATIVE
Ketones, UA: NEGATIVE
Nitrite, UA: POSITIVE
Protein, UA: POSITIVE — AB
Spec Grav, UA: 1.015 (ref 1.010–1.025)
Urobilinogen, UA: 0.2 E.U./dL
pH, UA: 6 (ref 5.0–8.0)

## 2019-01-18 MED ORDER — CIPROFLOXACIN HCL 500 MG PO TABS: 500.0000 mg | ORAL_TABLET | Freq: Two times a day (BID) | ORAL | 0 refills | Status: DC

## 2019-01-18 NOTE — Progress Notes (Signed)
Thomas Vinal T. Imaya Duffy, MD Primary Care and [place] at [facility] [place], [address] Phone: [phone]  FAX: Kanorado [name]  MRN [id number]  Date of Birth: 1965-07-03  Visit Date: 01/18/2019  PCP: Owens Loffler, MD  Referred by: Owens Loffler, MD  Chief Complaint  Patient presents with  . Burning with Urination   Subjective:   Thomas Blair is a 53 y.o. very pleasant male patient who presents with the following:  UTI, ? Pyelo.  Longer course of abx  He developed some food poisoning and had some nausea, vomiting, diarrhea.  This is started to get better, but that took 2 or 3 days.  Subsequently yesterday and today has been has some burning, urgency, and hesitancy when he is urinating.  He is not had any blood or discharge.  Past Medical History, Surgical History, Social History, Family History, Problem List, Medications, and Allergies have been reviewed and updated if relevant.  Patient Active Problem List   Diagnosis Date Noted  . Obesity (BMI 30-39.9) 04/30/2013  . Prediabetes 03/20/2013  . PROSTATITIS, ACUTE, CHRONIC 05/07/2009  . ORGANIC IMPOTENCE 05/07/2009  . Hyperlipidemia LDL goal <100 02/05/2009  . HYPERTENSION 01/01/2009    Past Medical History:  Diagnosis Date  . Allergy   . GERD (gastroesophageal reflux disease)   . Hernia   . History of chicken pox   . Hyperlipidemia   . Hypertension   . Prediabetes 03/20/2013    Past Surgical History:  Procedure Laterality Date  . HERNIA REPAIR      Social History   Socioeconomic History  . Marital status: Married    Spouse name: Not on file  . Number of children: Not on file  . Years of education: Not on file  . Highest education level: Not on file  Occupational History  . Occupation: [employer]  Social Needs  . Financial resource strain: Not on file  . Food insecurity    Worry: Not on file   Inability: Not on file  . Transportation needs    Medical: Not on file    Non-medical: Not on file  Tobacco Use  . Smoking status: Never Smoker  . Smokeless tobacco: Never Used  Substance and Sexual Activity  . Alcohol use: Yes    Alcohol/week: 6.0 standard drinks    Types: 6 Standard drinks or equivalent per week    Comment: weekly  . Drug use: No  . Sexual activity: Not on file  Lifestyle  . Physical activity    Days per week: Not on file    Minutes per session: Not on file  . Stress: Not on file  Relationships  . Social Herbalist on phone: Not on file    Gets together: Not on file    Attends religious service: Not on file    Active member of club or organization: Not on file    Attends meetings of clubs or organizations: Not on file    Relationship status: Not on file  . Intimate partner violence    Fear of current or ex partner: Not on file    Emotionally abused: Not on file    Physically abused: Not on file    Forced sexual activity: Not on file  Other Topics Concern  . Not on file  Social History Narrative   Exercise yes    Family History  Problem Relation Age  of Onset  . Colon cancer Neg Hx   . Colon polyps Neg Hx   . Esophageal cancer Neg Hx   . Rectal cancer Neg Hx   . Stomach cancer Neg Hx     Allergies  Allergen Reactions  . Penicillins     REACTION: rash    Medication list reviewed and updated in full in [place].  ROS: GEN: Acute illness details above GI: Tolerating PO intake GU: maintaining adequate hydration and urination Pulm: No SOB Interactive and getting along well at home.  Otherwise, ROS is as per the HPI.  Objective:   BP 120/68   Pulse 72   Temp 99.5 F (37.5 C) (Oral)   Ht 5' 6.25" (1.683 m)   Wt 210 lb (95.3 kg)   SpO2 94%   BMI 33.64 kg/m    GEN: WDWN, A&Ox4,NAD. Non-toxic HEENT: Atraumatc, normocephalic. CV: RRR, No M/G/R PULM: CTA B, No wheezes, crackles, or rhonchi ABD: S, NT, ND, +BS, no  rebound. + mild CVAT. + suprapubic tenderness. EXT: No c/c/e    Laboratory and Imaging Data: Results for orders placed or performed in visit on 01/18/19  POCT Urinalysis Dipstick (Automated)  Result Value Ref Range   Color, UA Yellow    Clarity, UA Hazy    Glucose, UA Negative Negative   Bilirubin, UA Negative    Ketones, UA Negative    Spec Grav, UA 1.015 1.010 - 1.025   Blood, UA Trace    pH, UA 6.0 5.0 - 8.0   Protein, UA Positive (A) Negative   Urobilinogen, UA 0.2 0.2 or 1.0 E.U./dL   Nitrite, UA Positive    Leukocytes, UA Small (1+) (A) Negative     Assessment and Plan:     ICD-10-CM   1. Burning with urination  R30.0 POCT Urinalysis Dipstick (Automated)    Urine Culture  2. Acute pyelonephritis  N10   3. Food poisoning  A05.9    Cystitis versus early pyelonephritis, treat as such.  Follow-up: No follow-ups on file.  Meds ordered this encounter  Medications  . ciprofloxacin (CIPRO) 500 MG tablet    Sig: Take 1 tablet (500 mg total) by mouth 2 (two) times daily.    Dispense:  20 tablet    Refill:  0   Orders Placed This Encounter  Procedures  . Urine Culture  . POCT Urinalysis Dipstick (Automated)    Signed,  Jacquilyn Seldon T. Zakia Sainato, MD   Outpatient Encounter Medications as of 01/18/2019  Medication Sig  . alfuzosin (UROXATRAL) 10 MG 24 hr tablet Take 1 tablet (10 mg total) by mouth at bedtime.  Marland Kitchen amLODipine (NORVASC) 10 MG tablet Take 1 tablet (10 mg total) by mouth daily.  Marland Kitchen ibuprofen (ADVIL,MOTRIN) 200 MG tablet Take 200 mg by mouth every 6 (six) hours as needed. Reported on 10/29/2015  . metoprolol tartrate (LOPRESSOR) 25 MG tablet Take 1 tablet (25 mg total) by mouth 2 (two) times daily.  . Multiple Vitamin (MULTIVITAMIN) tablet Take 1 tablet by mouth daily. Alive [employer]  . sildenafil (REVATIO) 20 MG tablet TAKE 2 TO 5 TABLETS 30 MIN PRIOR TO INTERCOURSE  . triamterene-hydrochlorothiazide (MAXZIDE-25) 37.5-25 MG tablet Take 1 tablet by mouth daily.   . ciprofloxacin (CIPRO) 500 MG tablet Take 1 tablet (500 mg total) by mouth 2 (two) times daily.   No facility-administered encounter medications on file as of 01/18/2019.

## 2019-01-19 ENCOUNTER — Telehealth: Payer: Self-pay

## 2019-01-19 NOTE — Telephone Encounter (Signed)
Pt's wife called early AM this morning and was advised since pt fever is now 62 with severe lower back pain all night(pt was crying due to pain), nausea, incontinence of urine I spoke with Dr Diona Browner and she advised pt should go to ED for eval and possible IV abx. pts wife voiced understanding and will take pt to [facility] ED at [facility].Due to several emergent calls just now putting note in at 10:20 AM.  FYI to Dr Lorelei Pont and Dr Diona Browner.

## 2019-01-20 LAB — URINE CULTURE
MICRO NUMBER:: 866573
SPECIMEN QUALITY:: ADEQUATE

## 2019-01-21 NOTE — Telephone Encounter (Signed)
absolutely

## 2019-02-02 ENCOUNTER — Telehealth: Payer: Self-pay

## 2019-02-02 NOTE — Telephone Encounter (Signed)
Leta Jungling (DPR signed)left v/m that pt was referred to prostate specialist at [facility] and pt needs to have a MRI but [facility] doctor wants to see previous prostate biopsy done approx 6 yrs ago. Mrs Springer said the doctor that did biopsy has moved out of state and she does not know how to get report and wants to know if [facility] has copy of prostate biopsy. Mrs Skaggs request cb.

## 2019-02-02 NOTE — Telephone Encounter (Signed)
Mary notified that we have office notes from [facility] that states prostate biopsy was negative but I do not have a copy of the pathology report.Marland KitchenMarland KitchenShe will check with [facility] to see if they want to office notes.  She will call me back if I need to do anything further.

## 2019-06-14 ENCOUNTER — Other Ambulatory Visit: Payer: Self-pay | Admitting: Family Medicine

## 2019-07-17 ENCOUNTER — Other Ambulatory Visit: Payer: Self-pay | Admitting: Family Medicine

## 2019-07-17 DIAGNOSIS — Z125 Encounter for screening for malignant neoplasm of prostate: Secondary | ICD-10-CM

## 2019-07-17 DIAGNOSIS — E785 Hyperlipidemia, unspecified: Secondary | ICD-10-CM

## 2019-07-17 DIAGNOSIS — Z79899 Other long term (current) drug therapy: Secondary | ICD-10-CM

## 2019-07-17 DIAGNOSIS — Z131 Encounter for screening for diabetes mellitus: Secondary | ICD-10-CM

## 2019-07-18 ENCOUNTER — Other Ambulatory Visit: Payer: Self-pay

## 2019-07-18 ENCOUNTER — Other Ambulatory Visit (INDEPENDENT_AMBULATORY_CARE_PROVIDER_SITE_OTHER): Payer: 59

## 2019-07-18 DIAGNOSIS — E785 Hyperlipidemia, unspecified: Secondary | ICD-10-CM | POA: Diagnosis not present

## 2019-07-18 DIAGNOSIS — Z131 Encounter for screening for diabetes mellitus: Secondary | ICD-10-CM | POA: Diagnosis not present

## 2019-07-18 DIAGNOSIS — Z125 Encounter for screening for malignant neoplasm of prostate: Secondary | ICD-10-CM

## 2019-07-18 DIAGNOSIS — Z79899 Other long term (current) drug therapy: Secondary | ICD-10-CM | POA: Diagnosis not present

## 2019-07-18 LAB — LIPID PANEL
Cholesterol: 206 mg/dL — ABNORMAL HIGH (ref 0–200)
HDL: 43.9 mg/dL (ref >39.00)
LDL Cholesterol: 135 mg/dL — ABNORMAL HIGH (ref 0–99)
NonHDL: 162.47
Total CHOL/HDL Ratio: 5
Triglycerides: 138 mg/dL (ref 0.0–149.0)
VLDL: 27.6 mg/dL (ref 0.0–40.0)

## 2019-07-18 LAB — HEPATIC FUNCTION PANEL
ALT: 22 U/L (ref 0–53)
AST: 14 U/L (ref 0–37)
Albumin: 4 g/dL (ref 3.5–5.2)
Alkaline Phosphatase: 76 U/L (ref 39–117)
Bilirubin, Direct: 0.1 mg/dL (ref 0.0–0.3)
Total Bilirubin: 0.7 mg/dL (ref 0.2–1.2)
Total Protein: 6.8 g/dL (ref 6.0–8.3)

## 2019-07-18 LAB — CBC WITH DIFFERENTIAL/PLATELET
Basophils Absolute: 0 10*3/uL (ref 0.0–0.1)
Basophils Relative: 0.3 % (ref 0.0–3.0)
Eosinophils Absolute: 0.2 10*3/uL (ref 0.0–0.7)
Eosinophils Relative: 3.1 % (ref 0.0–5.0)
HCT: 46.1 % (ref 39.0–52.0)
Hemoglobin: 15.9 g/dL (ref 13.0–17.0)
Lymphocytes Relative: 39.9 % (ref 12.0–46.0)
Lymphs Abs: 2.1 10*3/uL (ref 0.7–4.0)
MCHC: 34.4 g/dL (ref 30.0–36.0)
MCV: 86.6 fl (ref 78.0–100.0)
Monocytes Absolute: 0.4 10*3/uL (ref 0.1–1.0)
Monocytes Relative: 8.1 % (ref 3.0–12.0)
Neutro Abs: 2.6 10*3/uL (ref 1.4–7.7)
Neutrophils Relative %: 48.6 % (ref 43.0–77.0)
Platelets: 154 10*3/uL (ref 150.0–400.0)
RBC: 5.32 Mil/uL (ref 4.22–5.81)
RDW: 14.1 % (ref 11.5–15.5)
WBC: 5.3 10*3/uL (ref 4.0–10.5)

## 2019-07-18 LAB — BASIC METABOLIC PANEL
BUN: 19 mg/dL (ref 6–23)
CO2: 30 mEq/L (ref 19–32)
Calcium: 9.2 mg/dL (ref 8.4–10.5)
Chloride: 102 mEq/L (ref 96–112)
Creatinine, Ser: 1.07 mg/dL (ref 0.40–1.50)
GFR: 71.68 mL/min (ref >60.00)
Glucose, Bld: 124 mg/dL — ABNORMAL HIGH (ref 70–99)
Potassium: 3.8 mEq/L (ref 3.5–5.1)
Sodium: 140 mEq/L (ref 135–145)

## 2019-07-18 LAB — HEMOGLOBIN A1C: Hgb A1c MFr Bld: 5.9 % (ref 4.6–6.5)

## 2019-07-19 LAB — PSA, TOTAL WITH REFLEX TO PSA, FREE: PSA, Total: 18.9 ng/mL — ABNORMAL HIGH (ref <=4.0)

## 2019-07-23 ENCOUNTER — Ambulatory Visit (INDEPENDENT_AMBULATORY_CARE_PROVIDER_SITE_OTHER): Payer: 59 | Admitting: Family Medicine

## 2019-07-23 ENCOUNTER — Encounter: Payer: Self-pay | Admitting: Family Medicine

## 2019-07-23 ENCOUNTER — Other Ambulatory Visit: Payer: Self-pay

## 2019-07-23 VITALS — BP 120/80 | HR 60 | Temp 98.0°F | Ht 66.5 in | Wt 210.8 lb

## 2019-07-23 DIAGNOSIS — Z0000 Encounter for general adult medical examination without abnormal findings: Secondary | ICD-10-CM

## 2019-07-23 MED ORDER — METOPROLOL TARTRATE 25 MG PO TABS: 25.0000 mg | ORAL_TABLET | Freq: Two times a day (BID) | ORAL | 3 refills | Status: DC

## 2019-07-23 MED ORDER — SILDENAFIL CITRATE 20 MG PO TABS: ORAL_TABLET | ORAL | 11 refills | Status: DC

## 2019-07-23 MED ORDER — AMLODIPINE BESYLATE 10 MG PO TABS: 10.0000 mg | ORAL_TABLET | Freq: Every day | ORAL | 3 refills | Status: DC

## 2019-07-23 NOTE — Progress Notes (Signed)
Thomas Hlavac T. Deyani Hegarty, MD Primary Care and [place] at [facility] [place], [address] Phone: [phone]  FAX: Thomas Blair - 54 y.o. male  MRN [id number]  Date of Birth: 1965/11/20  Visit Date: 07/23/2019  PCP: Thomas Loffler, MD  Referred by: Thomas Loffler, MD  Chief Complaint  Patient presents with  . Annual Exam    This visit occurred during the SARS-CoV-2 public health emergency.  Safety protocols were in place, including screening questions prior to the visit, additional usage of staff PPE, and extensive cleaning of exam room while observing appropriate contact time as indicated for disinfecting solutions.   Patient Care Team: Thomas Loffler, MD as PCP - General Subjective:   Thomas Blair is a 54 y.o. pleasant patient who presents with the following:  Preventative Health Maintenance Visit:  Health Maintenance Summary Reviewed and updated, unless pt declines services.  Tobacco History Reviewed. Alcohol: No concerns, no excessive use Exercise Habits: Some activity, rec at least 30 mins 5 times a week STD concerns: no risk or activity to increase risk Drug Use: None  S/o 04/2019 prostate bx which showed chronic prostatitis and BPH without cancer.  Covid-19 vaccine  Wt Readings from Last 3 Encounters:  07/23/19 210 lb 12 oz (95.6 kg)  01/18/19 210 lb (95.3 kg)  07/19/18 214 lb (97.1 kg)    Has Meniere's and went 3 weeks ago.  ENT doctor gave him some prednisone.  Did help much with ETD dysfunction.   Has tried some flonase as well.   Health Maintenance  Topic Date Due  . HIV Screening  Never done  . INFLUENZA VACCINE  08/08/2019 (Originally 12/09/2018)  . COLONOSCOPY  10/29/2020  . TETANUS/TDAP  07/09/2024  . Hepatitis C Screening  Completed   Immunization History  Administered Date(s) Administered  . Influenza Split 02/15/2011, 03/06/2012  . Influenza Whole  02/05/2009  . Influenza, Seasonal, Injecte, Preservative Fre 03/11/2014  . Influenza,inj,Quad PF,6+ Mos 03/19/2013, 06/09/2016  . Tdap 07/10/2014   Patient Active Problem List   Diagnosis Date Noted  . Obesity (BMI 30-39.9) 04/30/2013  . Prediabetes 03/20/2013  . PROSTATITIS, ACUTE, CHRONIC 05/07/2009  . ORGANIC IMPOTENCE 05/07/2009  . Hyperlipidemia LDL goal <100 02/05/2009  . HYPERTENSION 01/01/2009    Past Medical History:  Diagnosis Date  . Allergy   . GERD (gastroesophageal reflux disease)   . Hernia   . History of chicken pox   . Hyperlipidemia   . Hypertension   . Prediabetes 03/20/2013    Past Surgical History:  Procedure Laterality Date  . HERNIA REPAIR      Family History  Problem Relation Age of Onset  . Colon cancer Neg Hx   . Colon polyps Neg Hx   . Esophageal cancer Neg Hx   . Rectal cancer Neg Hx   . Stomach cancer Neg Hx     Past Medical History, Surgical History, Social History, Family History, Problem List, Medications, and Allergies have been reviewed and updated if relevant.  Review of Systems: Pertinent positives are listed above.  Otherwise, a full 14 point review of systems has been done in full and it is negative except where it is noted positive.  Objective:   BP 120/80   Pulse 60   Temp 98 F (36.7 C) (Temporal)   Ht 5' 6.5" (1.689 m)   Wt 210 lb 12 oz (95.6 kg)   SpO2 96%   BMI 33.51 kg/m  Ideal Body Weight: Weight in (lb) to have BMI = 25: 156.9  Ideal Body Weight: Weight in (lb) to have BMI = 25: 156.9 No exam data present Depression screen [facility] 2/9 07/23/2019 07/19/2018 07/18/2017  Decreased Interest 0 0 0  Down, Depressed, Hopeless 0 0 0  PHQ - 2 Score 0 0 0     GEN: well developed, well nourished, no acute distress Eyes: conjunctiva and lids normal, PERRLA, EOMI ENT: TM clear, nares clear, oral exam WNL Neck: supple, no lymphadenopathy, no thyromegaly, no JVD Pulm: clear to auscultation and percussion, respiratory  effort normal CV: regular rate and rhythm, S1-S2, no murmur, rub or gallop, no bruits, peripheral pulses normal and symmetric, no cyanosis, clubbing, edema or varicosities GI: soft, non-tender; no hepatosplenomegaly, masses; active bowel sounds all quadrants GU: no hernia, testicular mass, penile discharge Lymph: no cervical, axillary or inguinal adenopathy MSK: gait normal, muscle tone and strength WNL, no joint swelling, effusions, discoloration, crepitus  SKIN: clear, good turgor, color WNL, no rashes, lesions, or ulcerations Neuro: normal mental status, normal strength, sensation, and motion Psych: alert; oriented to person, place and time, normally interactive and not anxious or depressed in appearance.  All labs reviewed with patient. Results for orders placed or performed in visit on 07/18/19  PSA, Total with Reflex to PSA, Free  Result Value Ref Range   PSA, Total 18.9 (H) < OR = 4.0 ng/mL  Hemoglobin A1c  Result Value Ref Range   Hgb A1c MFr Bld 5.9 4.6 - 6.5 %  Basic metabolic panel  Result Value Ref Range   Sodium 140 135 - 145 mEq/L   Potassium 3.8 3.5 - 5.1 mEq/L   Chloride 102 96 - 112 mEq/L   CO2 30 19 - 32 mEq/L   Glucose, Bld 124 (H) 70 - 99 mg/dL   BUN 19 6 - 23 mg/dL   Creatinine, Ser 1.07 0.40 - 1.50 mg/dL   GFR 71.68 >60.00 mL/min   Calcium 9.2 8.4 - 10.5 mg/dL  Hepatic function panel  Result Value Ref Range   Total Bilirubin 0.7 0.2 - 1.2 mg/dL   Bilirubin, Direct 0.1 0.0 - 0.3 mg/dL   Alkaline Phosphatase 76 39 - 117 U/L   AST 14 0 - 37 U/L   ALT 22 0 - 53 U/L   Total Protein 6.8 6.0 - 8.3 g/dL   Albumin 4.0 3.5 - 5.2 g/dL  CBC with Differential/Platelet  Result Value Ref Range   WBC 5.3 4.0 - 10.5 K/uL   RBC 5.32 4.22 - 5.81 Mil/uL   Hemoglobin 15.9 13.0 - 17.0 g/dL   HCT 46.1 39.0 - 52.0 %   MCV 86.6 78.0 - 100.0 fl   MCHC 34.4 30.0 - 36.0 g/dL   RDW 14.1 11.5 - 15.5 %   Platelets 154.0 150.0 - 400.0 K/uL   Neutrophils Relative % 48.6 43.0 -  77.0 %   Lymphocytes Relative 39.9 12.0 - 46.0 %   Monocytes Relative 8.1 3.0 - 12.0 %   Eosinophils Relative 3.1 0.0 - 5.0 %   Basophils Relative 0.3 0.0 - 3.0 %   Neutro Abs 2.6 1.4 - 7.7 K/uL   Lymphs Abs 2.1 0.7 - 4.0 K/uL   Monocytes Absolute 0.4 0.1 - 1.0 K/uL   Eosinophils Absolute 0.2 0.0 - 0.7 K/uL   Basophils Absolute 0.0 0.0 - 0.1 K/uL  Lipid panel  Result Value Ref Range   Cholesterol 206 (H) 0 - 200 mg/dL   Triglycerides 138.0 0.0 -  149.0 mg/dL   HDL 43.90 >39.00 mg/dL   VLDL 27.6 0.0 - 40.0 mg/dL   LDL Cholesterol 135 (H) 0 - 99 mg/dL   Total CHOL/HDL Ratio 5    NonHDL 162.47     Assessment and Plan:     ICD-10-CM   1. Healthcare maintenance  Z00.00     Health Maintenance Exam: The patient's preventative maintenance and recommended screening tests for an annual wellness exam were reviewed in full today. Brought up to date unless services declined.  Counselled on the importance of diet, exercise, and its role in overall health and mortality. The patient's FH and SH was reviewed, including their home life, tobacco status, and drug and alcohol status.  Follow-up in 1 year for physical exam or additional follow-up below.  Patient Instructions  Eustachian Tube Dysfunction: There is a tube that connects between the sinuses and behind the ear called the "eustachian tube." Sometimes when you have allergies, a cold, or nasal congestion for any reason this tube can get blocked and pressure cannot equalize in your ears. (Like if you swim in deep water) This can also trap fluid behind the ear and give you a full, pressure-like sensation that is uncomfortable, but it is not an ear infection.  Recommendations: Afrin Nasal Spray: 2 sprays twice a day for a maximum of 3-4 days (longer than this and your nose gets addicted, and you have rebound swelling that makes it worse.) Sudafed: Either pseudoephedrine or phenylephrine. As directed on box. (Not is heart problems or high blood  pressure) OK TO TAKE FOR ABOUT 2 WEEKS  Anti-Histamine: Allegra, Zyrtec, or Claritin. All over the counter now and once a day. Taking already   Nasal steroid. Nasacort is over the counter now. About 10 prescription ones exist.  If you develop fever > 100.4, then things can change fluid behind the ear does increase your risk of developing an ear infection.     Follow-up: No follow-ups on file. Or follow-up in 1 year if not noted.  Meds ordered this encounter  Medications  . sildenafil (REVATIO) 20 MG tablet    Sig: TAKE 2 TO 5 TABLETS 30 MIN PRIOR TO INTERCOURSE    Dispense:  50 tablet    Refill:  11  . amLODipine (NORVASC) 10 MG tablet    Sig: Take 1 tablet (10 mg total) by mouth daily.    Dispense:  90 tablet    Refill:  3  . metoprolol tartrate (LOPRESSOR) 25 MG tablet    Sig: Take 1 tablet (25 mg total) by mouth 2 (two) times daily.    Dispense:  180 tablet    Refill:  3   Medications Discontinued During This Encounter  Medication Reason  . ciprofloxacin (CIPRO) 500 MG tablet Completed Course  . alfuzosin (UROXATRAL) 10 MG 24 hr tablet Completed Course  . sildenafil (REVATIO) 20 MG tablet Reorder  . metoprolol tartrate (LOPRESSOR) 25 MG tablet Reorder  . amLODipine (NORVASC) 10 MG tablet Reorder   No orders of the defined types were placed in this encounter.   Signed,  Maud Deed. Jakeia Carreras, MD   Allergies as of 07/23/2019      Reactions   Penicillins    REACTION: rash      Medication List       Accurate as of July 23, 2019  9:56 AM. If you have any questions, ask your nurse or doctor.        STOP taking these medications   alfuzosin 10  MG 24 hr tablet Commonly known as: UROXATRAL Stopped by: Thomas Loffler, MD   ciprofloxacin 500 MG tablet Commonly known as: Cipro Stopped by: Thomas Loffler, MD     TAKE these medications   amLODipine 10 MG tablet Commonly known as: NORVASC Take 1 tablet (10 mg total) by mouth daily.   fluticasone 50 MCG/ACT  nasal spray Commonly known as: FLONASE Place 2 sprays into both nostrils daily.   ibuprofen 200 MG tablet Commonly known as: ADVIL Take 200 mg by mouth every 6 (six) hours as needed. Reported on 10/29/2015   metoprolol tartrate 25 MG tablet Commonly known as: LOPRESSOR Take 1 tablet (25 mg total) by mouth 2 (two) times daily.   multivitamin tablet Take 1 tablet by mouth daily. Alive [employer]   sildenafil 20 MG tablet Commonly known as: REVATIO TAKE 2 TO 5 TABLETS 30 MIN PRIOR TO INTERCOURSE   tamsulosin 0.4 MG Caps capsule Commonly known as: FLOMAX Take 0.4 mg by mouth daily.   triamterene-hydrochlorothiazide 37.5-25 MG tablet Commonly known as: MAXZIDE-25 Take 1 tablet by mouth daily.

## 2019-07-23 NOTE — Patient Instructions (Addendum)
Eustachian Tube Dysfunction: There is a tube that connects between the sinuses and behind the ear called the "eustachian tube." Sometimes when you have allergies, a cold, or nasal congestion for any reason this tube can get blocked and pressure cannot equalize in your ears. (Like if you swim in deep water) This can also trap fluid behind the ear and give you a full, pressure-like sensation that is uncomfortable, but it is not an ear infection.  Recommendations: Afrin Nasal Spray: 2 sprays twice a day for a maximum of 3-4 days (longer than this and your nose gets addicted, and you have rebound swelling that makes it worse.) Sudafed: Either pseudoephedrine or phenylephrine. As directed on box. (Not is heart problems or high blood pressure) OK TO TAKE FOR ABOUT 2 WEEKS  Anti-Histamine: Allegra, Zyrtec, or Claritin. All over the counter now and once a day. Taking already   Nasal steroid. Nasacort is over the counter now. About 10 prescription ones exist.  If you develop fever > 100.4, then things can change fluid behind the ear does increase your risk of developing an ear infection.

## 2019-07-23 NOTE — Addendum Note (Signed)
Addended by: Carter Kitten on: 07/23/2019 10:05 AM   Modules accepted: Orders

## 2019-08-17 ENCOUNTER — Telehealth: Payer: Self-pay | Admitting: Family Medicine

## 2019-08-17 NOTE — Telephone Encounter (Signed)
Kathlee Nations with Dr Katherina Right office at [facility] left v/m that pt was seen earlier today by Dr Richardson Landry and pt was advised to see his PCP but Dr Richardson Landry offered to order MRI of brain due to dizziness and rt sided hearing loss. Kathlee Nations wants to know if Dr Lorelei Pont wants Dr Richardson Landry to order the MRI of brain or would Dr Lorelei Pont prefer to see pt on 08/20/19 at 8:20 and order the MRI of brain himself.Kathlee Nations request cb.

## 2019-08-17 NOTE — Telephone Encounter (Signed)
Kathlee Nations notified as instructed by telephone.  She didn't feel that this was anything that couldn't wait until patient was evaluated by Dr. Lorelei Pont on Monday.  She states that Chauncey was instructed to go the ER if symptoms worsen over the weekend.

## 2019-08-17 NOTE — Telephone Encounter (Signed)
Pt having increased episodes of vomiting, dizziness and syncope.  Pt has had 4 episodes in the last week.  Pt will black out when he turns a certain way or even while driving. Certain sounds will set off the dizziness and syncope spells.   Its starting the affect his work and daily living.  Unsteady gait, patient this morning could hardly even walk this morning after his episode, wife states that he looked like a "kid playing airplanes" with his arms fully extended out to help balance himself.   Spoke with Dr Katherina Right office and they advised the patient that they do not think it is his Meniere's disease, they feel it is something else going on. They want his Sodium levels checked. Wife states that she has been managing his sodium levels so she feels its something else going on.    Wife requested an appt with Dr Lorelei Pont for Monday 4/12 They did not feel that it was a good idea to come in today given his current state  Will send to [place] as Juluis Rainier

## 2019-08-17 NOTE — Telephone Encounter (Signed)
I have not evaluated the patient. If he feels that it is appropriate to order it right now, then I'll defer to his judgment.  If he believes it can wait, then I'm happy to evaluate the patient and make those decisions At that time.

## 2019-08-19 NOTE — Progress Notes (Addendum)
Thomas Blair T. Thomas Blair, Thomas Blair, [name] at [facility] [place], [address]  Phone: [phone]  FAX: Alpine [name] - 54 y.o. male  MRN [id number]  Date of Birth: [birth year]  Date: 08/20/2019  PCP: Thomas Loffler, Thomas Blair  Referral: Thomas Loffler, Thomas Blair  Chief Complaint  Patient presents with  . Dizziness  . Tinnitus    This visit occurred during the SARS-CoV-2 public health emergency.  Safety protocols were in place, including screening questions prior to the visit, additional usage of staff PPE, and extensive cleaning of exam room while observing appropriate contact time as indicated for disinfecting solutions.   Subjective:   Thomas Blair is a 54 y.o. very pleasant male patient with Body mass index is 33.63 kg/m. who presents with the following:  He was seen by ENT last week, and he was seen with presyncope versus syncope versus inner ear dysfunction:  Dizziness and vomitting.  Thought Dr. Richardson Landry.  Feels like everything is turning upside down.   Feels like his head is clogging. Table will "flip over."  He feels like this is different from his prior Mnire's disease flareups, and the ENT doctor also does not think that it is consistent with Mnire's disease  Got in his vehicle to go home.  When he turned the corner everything flipped over and go nauseated.  Got nauseated.  Waited until he go home.  No clear words other than head felt full.   No syncope.  He does not think that he has been presyncopal.  Wife found him laying on the ground.  Lying on stomach.  Was extremely dizzy - lasted for hours.  His walking gait has altered.    Dr. B Asked about his diet.  Other things can bring on a men attack.  Not sure if that brough it - no emergency meds.   Dr B says not overdoing his sodium.  Has been very good with this.  Unsteady on one foot  Per wife, memory  changes, personality changes.  Acting somewhat differently compared to prior behaviors.  Other family members have noticed, too.  He is been much more irritable, blank in affect, decreased engagement.  Memory difficulties.  He also accused his wife of possibly having an affair with a 37 year old gentleman.  Multiple people and friends are concerned about his behavioral patterns.  Review of Systems is noted in the HPI, as appropriate  Objective:   BP 118/72   Pulse (!) 59   Temp 98.7 F (37.1 C) (Temporal)   Ht 5' 6.5" (1.689 m)   Wt 211 lb 8 oz (95.9 kg)   SpO2 96%   BMI 33.63 kg/m    GEN: WDWN, NAD, Non-toxic HEENT: Atraumatic, Normocephalic. Neck supple. No masses. CV: RRR, No M/G/R. No JVD. No thrill. No extra heart sounds. PULM: CTA B, no wheezes, crackles, rhonchi. No retractions. No resp. distress. No accessory muscle use. EXTR: No c/c/e  Neuro: CN 2-12 grossly intact. PERRLA. EOMI. Sensation intact throughout. Str 5/5 all extremities. DTR 2+. No clonus. A and o x 4. Romberg +. Finger nose neg. Falls with standing on 1 foot and in standing in ta  PSYCH: Normally interactive. Conversant. Not depressed or anxious appearing.  Calm demeanor.     Laboratory and Imaging Data: Results for orders placed or performed in visit on 07/18/19  PSA, Total with Reflex to PSA, Free  Result Value  Ref Range   PSA, Total 18.9 (H) < OR = 4.0 ng/mL  Hemoglobin A1c  Result Value Ref Range   Hgb A1c MFr Bld 5.9 4.6 - 6.5 %  Basic metabolic panel  Result Value Ref Range   Sodium 140 135 - 145 mEq/L   Potassium 3.8 3.5 - 5.1 mEq/L   Chloride 102 96 - 112 mEq/L   CO2 30 19 - 32 mEq/L   Glucose, Bld 124 (H) 70 - 99 mg/dL   BUN 19 6 - 23 mg/dL   Creatinine, Ser 1.07 0.40 - 1.50 mg/dL   GFR 71.68 >60.00 mL/min   Calcium 9.2 8.4 - 10.5 mg/dL  Hepatic function panel  Result Value Ref Range   Total Bilirubin 0.7 0.2 - 1.2 mg/dL   Bilirubin, Direct 0.1 0.0 - 0.3 mg/dL   Alkaline Phosphatase 76  39 - 117 U/L   AST 14 0 - 37 U/L   ALT 22 0 - 53 U/L   Total Protein 6.8 6.0 - 8.3 g/dL   Albumin 4.0 3.5 - 5.2 g/dL  CBC with Differential/Platelet  Result Value Ref Range   WBC 5.3 4.0 - 10.5 K/uL   RBC 5.32 4.22 - 5.81 Mil/uL   Hemoglobin 15.9 13.0 - 17.0 g/dL   HCT 46.1 39.0 - 52.0 %   MCV 86.6 78.0 - 100.0 fl   MCHC 34.4 30.0 - 36.0 g/dL   RDW 14.1 11.5 - 15.5 %   Platelets 154.0 150.0 - 400.0 K/uL   Neutrophils Relative % 48.6 43.0 - 77.0 %   Lymphocytes Relative 39.9 12.0 - 46.0 %   Monocytes Relative 8.1 3.0 - 12.0 %   Eosinophils Relative 3.1 0.0 - 5.0 %   Basophils Relative 0.3 0.0 - 3.0 %   Neutro Abs 2.6 1.4 - 7.7 K/uL   Lymphs Abs 2.1 0.7 - 4.0 K/uL   Monocytes Absolute 0.4 0.1 - 1.0 K/uL   Eosinophils Absolute 0.2 0.0 - 0.7 K/uL   Basophils Absolute 0.0 0.0 - 0.1 K/uL  Lipid panel  Result Value Ref Range   Cholesterol 206 (H) 0 - 200 mg/dL   Triglycerides 138.0 0.0 - 149.0 mg/dL   HDL 43.90 >39.00 mg/dL   VLDL 27.6 0.0 - 40.0 mg/dL   LDL Cholesterol 135 (H) 0 - 99 mg/dL   Total CHOL/HDL Ratio 5    NonHDL 162.47     Lab Review:  CBC EXTENDED Latest Ref Rng & Units 07/18/2019 07/17/2018 07/15/2017  WBC 4.0 - 10.5 K/uL 5.3 7.1 5.0  RBC 4.22 - 5.81 Mil/uL 5.32 5.41 5.29  HGB 13.0 - 17.0 g/dL 15.9 15.8 15.6  HCT 39.0 - 52.0 % 46.1 45.2 44.9  PLT 150.0 - 400.0 K/uL 154.0 190.0 188.0  NEUTROABS 1.4 - 7.7 K/uL 2.6 3.2 2.8  LYMPHSABS 0.7 - 4.0 K/uL 2.1 3.2 1.7    BMP Latest Ref Rng & Units 07/18/2019 07/17/2018 07/15/2017  Glucose 70 - 99 mg/dL 124(H) 124(H) 120(H)  BUN 6 - 23 mg/dL _0 Creatinine 0.40 - 1.50 mg/dL 1.07 1.00 0.97  Sodium 135 - 145 mEq/L 140 139 140  Potassium 3.5 - 5.1 mEq/L 3.8 3.5 3.9  Chloride 96 - 112 mEq/L 102 101 103  CO2 19 - 32 mEq/L _1 Calcium 8.4 - 10.5 mg/dL 9.2 9.7 9.6    Hepatic Function Latest Ref Rng & Units 07/18/2019 07/17/2018 07/15/2017  Total Protein 6.0 - 8.3 g/dL 6.8 7.3 7.2  Albumin 3.5 - 5.2  g/dL 4.0 4.7 4.3  AST  0 - 37 U/L _0 ALT 0 - 53 U/L _1 Alk Phosphatase 39 - 117 U/L 76 82 61  Total Bilirubin 0.2 - 1.2 mg/dL 0.7 0.8 0.7  Bilirubin, Direct 0.0 - 0.3 mg/dL 0.1 0.2 0.2    Lab Results  Component Value Date   CHOL 206 (H) 07/18/2019   Lab Results  Component Value Date   HDL 43.90 07/18/2019   Lab Results  Component Value Date   LDLCALC 135 (H) 07/18/2019   Lab Results  Component Value Date   TRIG 138.0 07/18/2019   Lab Results  Component Value Date   CHOLHDL 5 07/18/2019   Lab Results  Component Value Date   HGBA1C 5.9 07/18/2019   HGBA1C 5.9 07/17/2018   HGBA1C 5.7 07/15/2017   Lab Results  Component Value Date   LDLCALC 135 (H) 07/18/2019   CREATININE 1.07 07/18/2019    Assessment and Plan:     ICD-10-CM   1. Cerebellar ataxia in diseases classified elsewhere [facility])  G32.81 MR Brain W Wo Contrast    MR BRAIN/IAC W WO CONTRAST    CANCELED: MR Brain W Wo Contrast  2. Dizziness  R42 EKG 12-Lead    MR Brain W Wo Contrast    MR BRAIN/IAC W WO CONTRAST    CANCELED: MR Brain W Wo Contrast  3. Meniere's disease, unspecified laterality  H81.09 MR Brain W Wo Contrast    MR BRAIN/IAC W WO CONTRAST    CANCELED: MR Brain W Wo Contrast  4. Abnormal neurological exam  R29.90 MR Brain W Wo Contrast    MR BRAIN/IAC W WO CONTRAST    CANCELED: MR Brain W Wo Contrast  5. Ataxia  R27.0 MR Brain W Wo Contrast    MR BRAIN/IAC W WO CONTRAST    CANCELED: MR Brain W Wo Contrast  6. Memory change  R41.3 MR Brain W Wo Contrast    MR BRAIN/IAC W WO CONTRAST    CANCELED: MR Brain W Wo Contrast  7. Personality change  F68.8 MR Brain W Wo Contrast    MR BRAIN/IAC W WO CONTRAST    CANCELED: MR Brain W Wo Contrast  8. Other symptoms and signs involving the nervous system  R29.818 MR Brain W Wo Contrast    MR BRAIN/IAC W WO CONTRAST    CANCELED: MR Brain W Wo Contrast  9. Tinnitus of both ears  H93.13 MR BRAIN/IAC W WO CONTRAST  10. Mixed conductive and sensorineural hearing  loss of both ears  H90.6 MR BRAIN/IAC W WO CONTRAST  11. Vertigo of central origin  H81.4 MR BRAIN/IAC W WO CONTRAST   Total encounter time: 60 minutes. On the day of the patient encounter, this can include review of prior records, labs, and imaging.  Additional time can include counselling, consultation with peer Thomas Blair in person or by telephone.  This also includes independent review of Radiology.  Obtained a MRI of the brain with and without contrast to evaluate for potential neoplasm, subacute stroke, possible cerebellar stroke, cerebral stroke, MS or other neurodegenerative disorder.  Multiple etiologies are possible.  He certainly could have also had an acute stroke or multiple acute strokes.  Abnormal neurological exam.  Ataxia with walking.  Positive Romberg, unable to stand on 1 foot.  Memory and personality changes.  EKG: Normal sinus rhythm. Normal axis, normal R wave progression, No acute ST elevation or depression.   He also has some  tinnitus.  I reviewed this in detail with him.  Some anxiety at nighttime.  I would have told him to try Benadryl before sleep.  Addendum, 1:27 PM 08/20/19  ENT has called and requested that we add IAC to the MRI of brain. Meniere's ? Or other ENT process, eval IAC. Electronically Signed  By: Thomas Loffler, Thomas Blair On: 08/20/2019 1:27 PM   Follow-up: No follow-ups on file.  Meds ordered this encounter  Medications  . diazepam (VALIUM) 2 MG tablet    Sig: Take 1 tablet about 1 1/2 hours before your MRI, and if you feel nervous while waiting, you can take then second one.    Dispense:  2 tablet    Refill:  0   Medications Discontinued During This Encounter  Medication Reason  . tamsulosin (FLOMAX) 0.4 MG CAPS capsule Change in therapy   Orders Placed This Encounter  Procedures  . MR Brain W Wo Contrast  . MR BRAIN/IAC W WO CONTRAST  . EKG 12-Lead    Signed,  Rhyland Hinderliter T. Holly Iannaccone, Thomas Blair   Outpatient Encounter Medications as of 08/20/2019  Medication  Sig  . amLODipine (NORVASC) 10 MG tablet Take 1 tablet (10 mg total) by mouth daily.  . fluticasone (FLONASE) 50 MCG/ACT nasal spray Place 2 sprays into both nostrils daily.  Marland Kitchen ibuprofen (ADVIL,MOTRIN) 200 MG tablet Take 200 mg by mouth every 6 (six) hours as needed. Reported on 10/29/2015  . metoprolol tartrate (LOPRESSOR) 25 MG tablet Take 1 tablet (25 mg total) by mouth 2 (two) times daily.  . Multiple Vitamin (MULTIVITAMIN) tablet Take 1 tablet by mouth daily. [employer]ive [employer]  . sildenafil (REVATIO) 20 MG tablet TAKE 2 TO 5 TABLETS 30 MIN PRIOR TO INTERCOURSE  . silodosin (RAPAFLO) 4 MG CAPS capsule 1 PILL DAILY PRIOR TO BED  . triamterene-hydrochlorothiazide (MAXZIDE-25) 37.5-25 MG tablet Take 1 tablet by mouth daily.  . diazepam (VALIUM) 2 MG tablet Take 1 tablet about 1 1/2 hours before your MRI, and if you feel nervous while waiting, you can take then second one.  . [DISCONTINUED] tamsulosin (FLOMAX) 0.4 MG CAPS capsule Take 0.4 mg by mouth daily.   No facility-administered encounter medications on file as of 08/20/2019.

## 2019-08-20 ENCOUNTER — Encounter: Payer: Self-pay | Admitting: Family Medicine

## 2019-08-20 ENCOUNTER — Telehealth: Payer: Self-pay | Admitting: Family Medicine

## 2019-08-20 ENCOUNTER — Ambulatory Visit (INDEPENDENT_AMBULATORY_CARE_PROVIDER_SITE_OTHER): Payer: 59 | Admitting: Family Medicine

## 2019-08-20 ENCOUNTER — Other Ambulatory Visit: Payer: Self-pay

## 2019-08-20 VITALS — BP 118/72 | HR 59 | Temp 98.7°F | Ht 66.5 in | Wt 211.5 lb

## 2019-08-20 DIAGNOSIS — R42 Dizziness and giddiness: Secondary | ICD-10-CM | POA: Diagnosis not present

## 2019-08-20 DIAGNOSIS — R29818 Other symptoms and signs involving the nervous system: Secondary | ICD-10-CM

## 2019-08-20 DIAGNOSIS — H9313 Tinnitus, bilateral: Secondary | ICD-10-CM

## 2019-08-20 DIAGNOSIS — H814 Vertigo of central origin: Secondary | ICD-10-CM

## 2019-08-20 DIAGNOSIS — F688 Other specified disorders of adult personality and behavior: Secondary | ICD-10-CM

## 2019-08-20 DIAGNOSIS — G3281 Cerebellar ataxia in diseases classified elsewhere: Secondary | ICD-10-CM | POA: Diagnosis not present

## 2019-08-20 DIAGNOSIS — H906 Mixed conductive and sensorineural hearing loss, bilateral: Secondary | ICD-10-CM

## 2019-08-20 DIAGNOSIS — H8109 Meniere's disease, unspecified ear: Secondary | ICD-10-CM

## 2019-08-20 DIAGNOSIS — R2990 Unspecified symptoms and signs involving the nervous system: Secondary | ICD-10-CM

## 2019-08-20 DIAGNOSIS — R270 Ataxia, unspecified: Secondary | ICD-10-CM

## 2019-08-20 DIAGNOSIS — R413 Other amnesia: Secondary | ICD-10-CM

## 2019-08-20 MED ORDER — DIAZEPAM 2 MG PO TABS: ORAL_TABLET | ORAL | 0 refills | Status: DC

## 2019-08-20 NOTE — Addendum Note (Signed)
Addended by: Owens Loffler on: 08/20/2019 01:27 PM   Modules accepted: Orders

## 2019-08-20 NOTE — Telephone Encounter (Signed)
ENT called, and I had to change the MRI of his brain to include part of the ear.  I confirmed with radiology that this is the right study. Apologies, and I appreciate your help.

## 2019-08-20 NOTE — Telephone Encounter (Signed)
Received a call from Kathlee Nations with [facility] for add on orders for upcoming MRI.  Please add on IAC's to the MRI - d/t hearing asymmetry on right side.   Please advise, thanks.

## 2019-08-21 ENCOUNTER — Ambulatory Visit
Admission: RE | Admit: 2019-08-21 | Discharge: 2019-08-21 | Disposition: A | Discharge status: Home / Self Care | Payer: 59 | Source: Ambulatory Visit | Attending: Family Medicine | Admitting: Family Medicine

## 2019-08-21 DIAGNOSIS — R2990 Unspecified symptoms and signs involving the nervous system: Secondary | ICD-10-CM | POA: Diagnosis present

## 2019-08-21 DIAGNOSIS — F688 Other specified disorders of adult personality and behavior: Secondary | ICD-10-CM | POA: Diagnosis present

## 2019-08-21 DIAGNOSIS — H8109 Meniere's disease, unspecified ear: Secondary | ICD-10-CM

## 2019-08-21 DIAGNOSIS — G3281 Cerebellar ataxia in diseases classified elsewhere: Secondary | ICD-10-CM | POA: Diagnosis present

## 2019-08-21 DIAGNOSIS — R29818 Other symptoms and signs involving the nervous system: Secondary | ICD-10-CM | POA: Diagnosis present

## 2019-08-21 DIAGNOSIS — R42 Dizziness and giddiness: Secondary | ICD-10-CM | POA: Diagnosis present

## 2019-08-21 DIAGNOSIS — R270 Ataxia, unspecified: Secondary | ICD-10-CM

## 2019-08-21 DIAGNOSIS — R413 Other amnesia: Secondary | ICD-10-CM | POA: Diagnosis present

## 2019-08-21 MED ORDER — GADOBUTROL 1 MMOL/ML IV SOLN
9.0000 mL | Freq: Once | INTRAVENOUS | Status: AC | PRN
  Administered 2019-08-21: 9 mL via INTRAVENOUS

## 2019-08-28 ENCOUNTER — Other Ambulatory Visit: Payer: Self-pay | Admitting: Family Medicine

## 2019-08-28 DIAGNOSIS — R2990 Unspecified symptoms and signs involving the nervous system: Secondary | ICD-10-CM

## 2019-08-28 DIAGNOSIS — R270 Ataxia, unspecified: Secondary | ICD-10-CM

## 2019-08-28 DIAGNOSIS — F688 Other specified disorders of adult personality and behavior: Secondary | ICD-10-CM

## 2019-08-28 DIAGNOSIS — R413 Other amnesia: Secondary | ICD-10-CM

## 2019-08-28 DIAGNOSIS — R42 Dizziness and giddiness: Secondary | ICD-10-CM

## 2019-08-28 NOTE — Progress Notes (Signed)
consult

## 2019-09-24 ENCOUNTER — Other Ambulatory Visit: Payer: Self-pay | Admitting: Neurology

## 2019-09-24 DIAGNOSIS — R4921 Hypernasality: Secondary | ICD-10-CM

## 2019-10-02 ENCOUNTER — Telehealth: Payer: Self-pay

## 2019-10-02 NOTE — Telephone Encounter (Signed)
Patient contacted the office stating that he has a MRI of his neck scheduled for Thursday 10/04/19. He states he gets very anxious and claustrophobic, and he would like Dr. Lorelei Pont to call in an rx for Valium for him. He states Dr. Lorelei Pont has called in this rx for him for previous MRI's he has had.  Please advise.  [facility] [address] - [place]

## 2019-10-02 NOTE — Telephone Encounter (Signed)
Rod notified as instructed by telephone.  He still has one tablet of the valium left so he will just take that 30 minutes prior to his neck MRI.

## 2019-10-02 NOTE — Telephone Encounter (Signed)
I sent him in Valium, number 2 tablets for his brain MRI.  He should have 1 left.

## 2019-10-05 ENCOUNTER — Ambulatory Visit
Admission: RE | Admit: 2019-10-05 | Discharge: 2019-10-05 | Disposition: A | Discharge status: Home / Self Care | Payer: 59 | Source: Ambulatory Visit | Attending: Neurology | Admitting: Neurology

## 2019-10-05 ENCOUNTER — Other Ambulatory Visit: Payer: Self-pay

## 2019-10-05 DIAGNOSIS — R4921 Hypernasality: Secondary | ICD-10-CM

## 2019-10-05 MED ORDER — GADOBUTROL 1 MMOL/ML IV SOLN
9.0000 mL | Freq: Once | INTRAVENOUS | Status: AC | PRN
  Administered 2019-10-05: 9 mL via INTRAVENOUS

## 2019-12-28 ENCOUNTER — Other Ambulatory Visit: Payer: Self-pay | Admitting: Physician Assistant

## 2019-12-28 DIAGNOSIS — IMO0001 Reserved for inherently not codable concepts without codable children: Secondary | ICD-10-CM

## 2019-12-28 DIAGNOSIS — H818X9 Other disorders of vestibular function, unspecified ear: Secondary | ICD-10-CM

## 2019-12-28 DIAGNOSIS — H9041 Sensorineural hearing loss, unilateral, right ear, with unrestricted hearing on the contralateral side: Secondary | ICD-10-CM

## 2020-01-16 ENCOUNTER — Ambulatory Visit
Admission: RE | Admit: 2020-01-16 | Discharge: 2020-01-16 | Disposition: A | Discharge status: Home / Self Care | Payer: 59 | Source: Ambulatory Visit | Attending: Physician Assistant | Admitting: Physician Assistant

## 2020-01-16 ENCOUNTER — Other Ambulatory Visit: Payer: Self-pay

## 2020-01-16 DIAGNOSIS — IMO0001 Reserved for inherently not codable concepts without codable children: Secondary | ICD-10-CM

## 2020-01-16 DIAGNOSIS — H818X9 Other disorders of vestibular function, unspecified ear: Secondary | ICD-10-CM | POA: Diagnosis present

## 2020-01-16 DIAGNOSIS — H9041 Sensorineural hearing loss, unilateral, right ear, with unrestricted hearing on the contralateral side: Secondary | ICD-10-CM | POA: Insufficient documentation

## 2020-01-16 MED ORDER — GADOBUTROL 1 MMOL/ML IV SOLN
10.0000 mL | Freq: Once | INTRAVENOUS | Status: AC | PRN
  Administered 2020-01-16: 10 mL via INTRAVENOUS

## 2020-06-12 DIAGNOSIS — H9041 Sensorineural hearing loss, unilateral, right ear, with unrestricted hearing on the contralateral side: Secondary | ICD-10-CM | POA: Insufficient documentation

## 2020-06-14 IMAGING — MR MR HEAD WO/W CM
15 series · 48 of 48 positions shown · IV contrast (gadavist)
Comparison: None.
COMPARISON: None.

Addendum:
CLINICAL DATA: Dizziness with nausea and vomiting

EXAM:
MRI HEAD WITHOUT AND WITH CONTRAST
TECHNIQUE: Multiplanar, multiecho pulse sequences of the brain and surrounding
structures were obtained without and with intravenous contrast.
CONTRAST:  9mL GADAVIST GADOBUTROL 1 MMOL/ML IV SOLN

[Series 5: ax dwi_tracew · axial · 3.0mm · 0.60mm/px · z∈[-76,+75]mm · 4 of 48 slices shown]
[im 1/48]
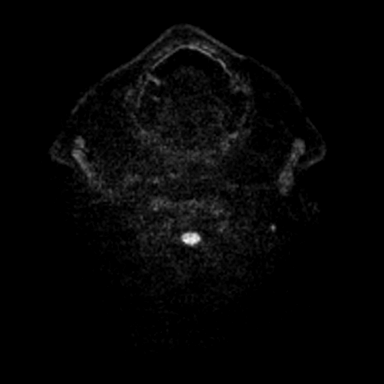
[im 16/48]
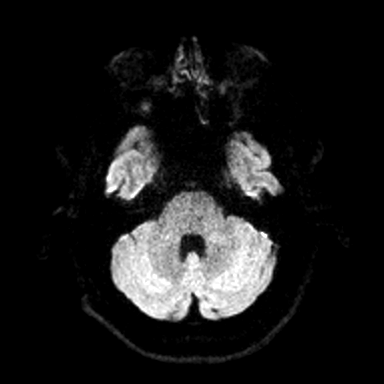
[im 32/48]
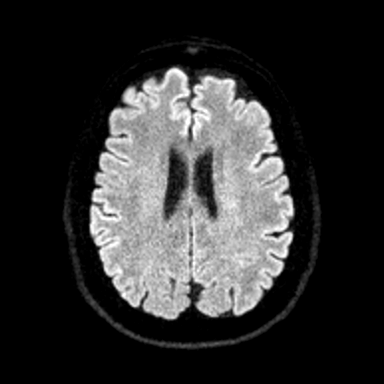
[im 48/48]
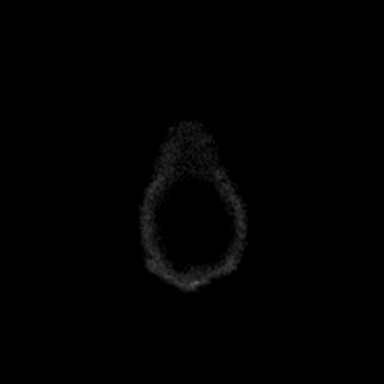

[Series 6: ax dwi_adc · axial · 3.0mm · 0.60mm/px · z∈[-76,+75]mm · 3 of 48 slices shown]
[im 1/48]
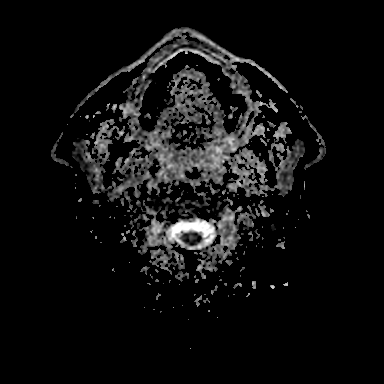
[im 24/48]
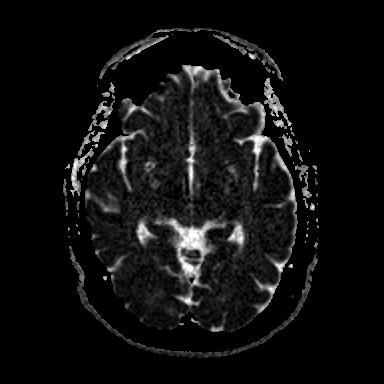
[im 48/48]
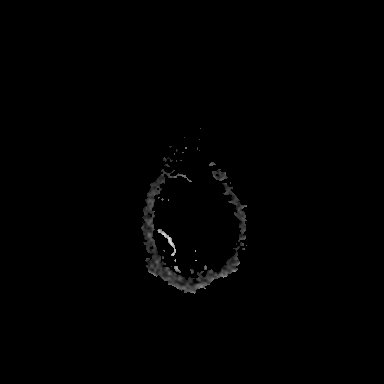

[Series 7: cor dwi_tracew · coronal · 5.0mm · 0.60mm/px · 2 of 40 slices shown]
[im 1/40]
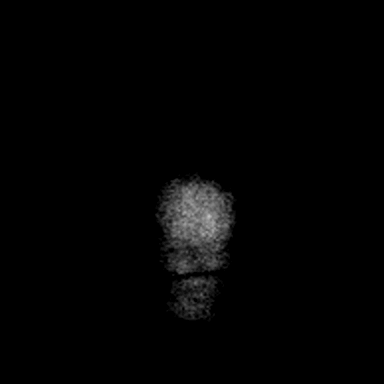
[im 40/40]
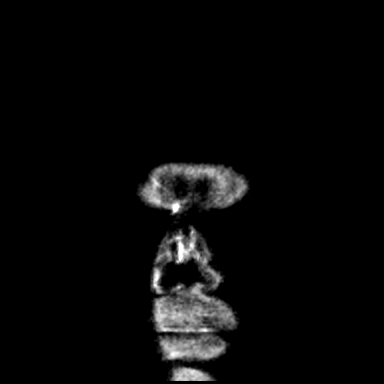

[Series 8: cor dwi_adc · coronal · 5.0mm · 0.60mm/px · 2 of 40 slices shown]
[im 1/40]
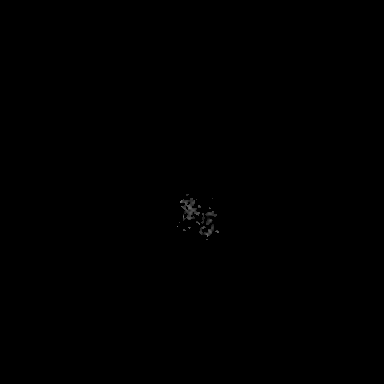
[im 40/40]
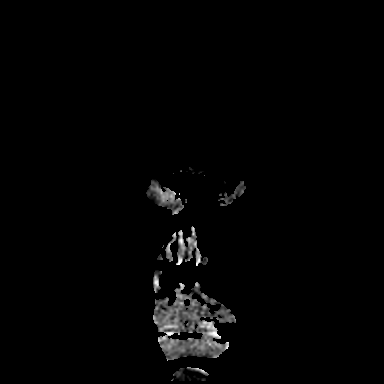

[Series 9: T1 · sagittal · 5.0mm · 0.62mm/px · 1 of 23 slices shown (1 of 2)]
[im 1/23]
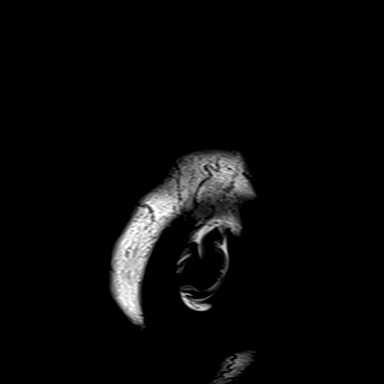

[Series 10: T2 · axial · 5.0mm · 0.53mm/px · 1 of 27 slices shown]
[im 1/27]
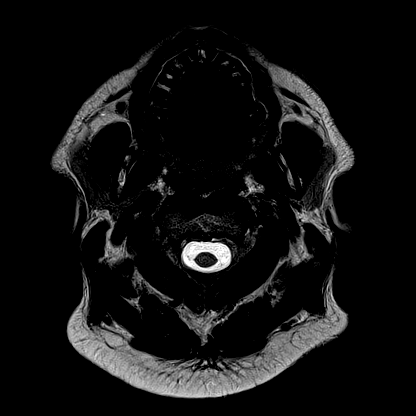

[Series 11: mag_images · axial · 3.0mm · 0.90mm/px · z∈[-85,+88]mm · 3 of 60 slices shown]
[im 1/60]
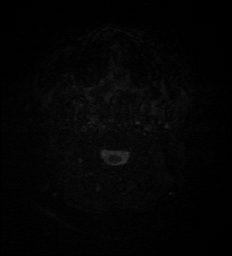
[im 30/60]
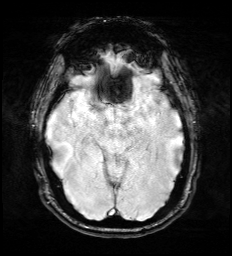
[im 60/60]
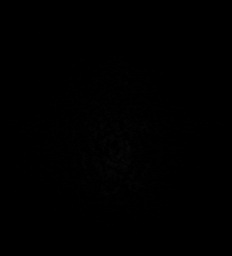

[Series 12: pha_images · axial · 3.0mm · 0.90mm/px · z∈[-85,+88]mm · 3 of 60 slices shown]
[im 1/60]
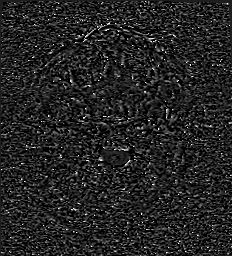
[im 30/60]
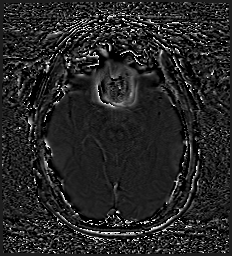
[im 60/60]
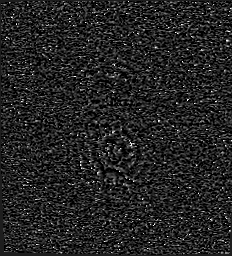

[Series 13: swi_images · axial · 3.0mm · 0.90mm/px · z∈[-85,+88]mm · 3 of 60 slices shown]
[im 1/60]
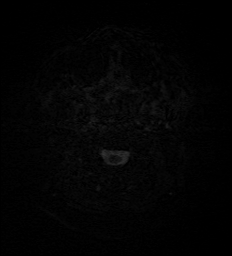
[im 30/60]
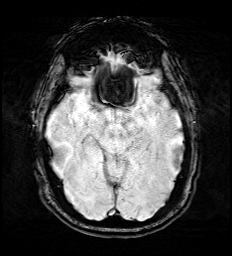
[im 60/60]
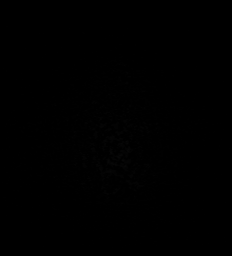

[Series 15: FLAIR · axial · 3.0mm · 0.53mm/px · z∈[-79,+79]mm · 3 of 55 slices shown]
[im 1/55]
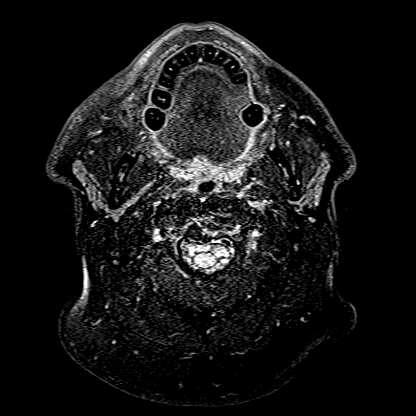
[im 28/55]
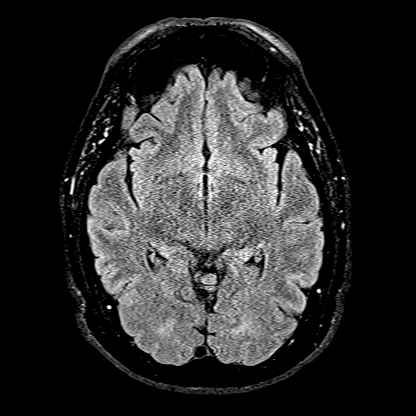
[im 55/55]
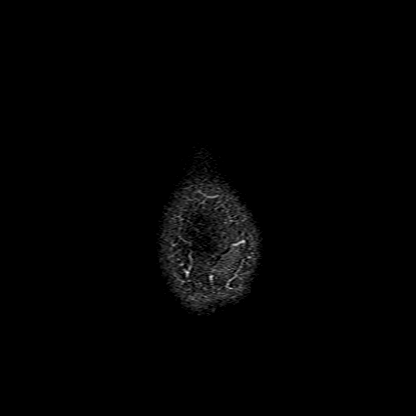

[Series 16: T1 · axial · 1.0mm · 0.98mm/px · z∈[-83,+87]mm · 9 of 176 slices shown (2 of 2)]
[im 1/176]
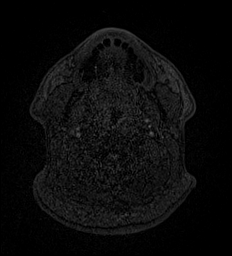
[im 22/176]
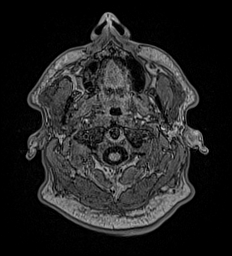
[im 44/176]
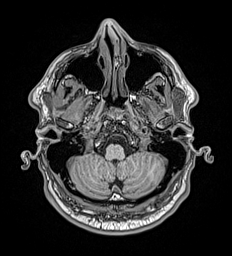
[im 66/176]
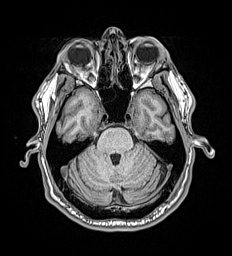
[im 88/176]
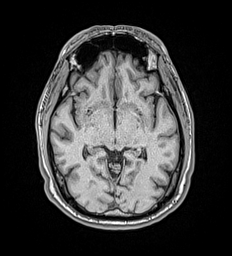
[im 110/176]
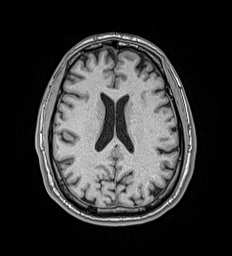
[im 132/176]
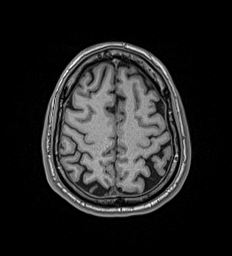
[im 154/176]
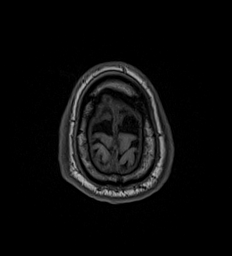
[im 176/176]
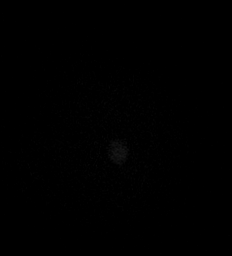

[Series 17: T2 post-contrast · coronal · 5.0mm · 0.57mm/px · 2 of 29 slices shown]
[im 1/29]
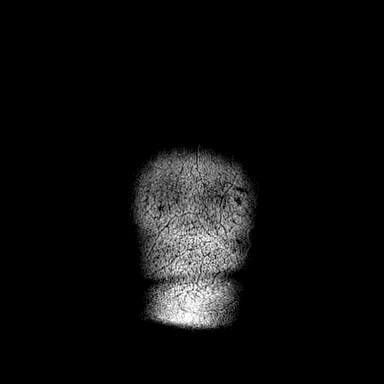
[im 29/29]
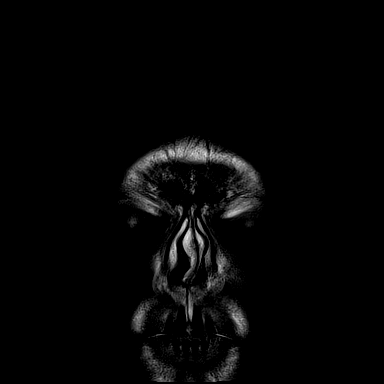

[Series 18: T1 post-contrast · axial · 1.0mm · 0.98mm/px · z∈[-83,+87]mm · 9 of 176 slices shown (1 of 3)]
[im 1/176]
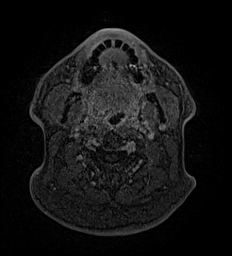
[im 22/176]
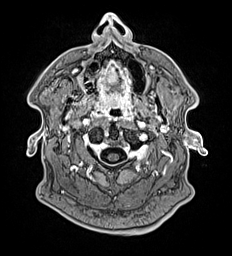
[im 44/176]
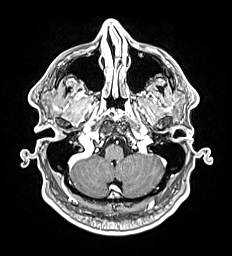
[im 66/176]
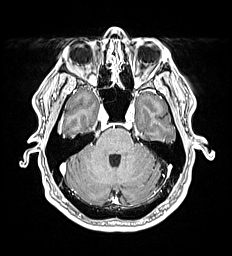
[im 88/176]
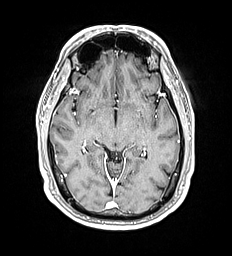
[im 110/176]
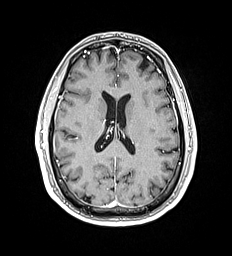
[im 132/176]
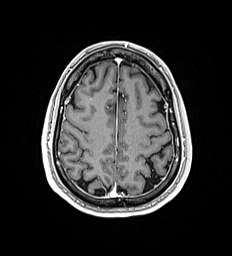
[im 154/176]
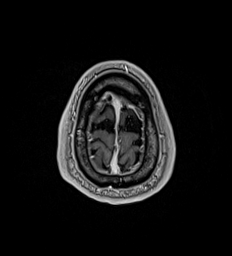
[im 176/176]
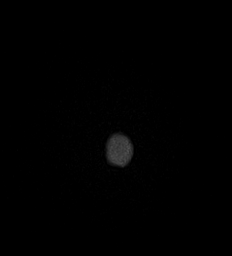

[Series 19: T1 post-contrast · coronal · 5.0mm · 0.57mm/px · 2 of 29 slices shown (2 of 3)]
[im 1/29]
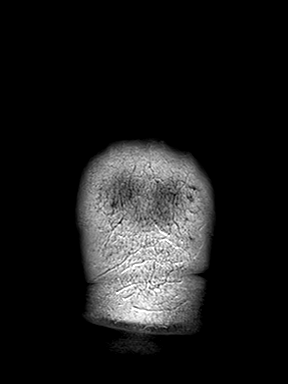
[im 29/29]
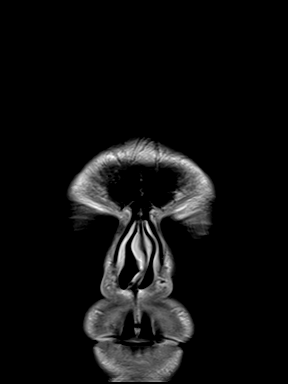

[Series 20: T1 post-contrast · sagittal · 5.0mm · 0.62mm/px · 1 of 23 slices shown (3 of 3)]
[im 1/23]
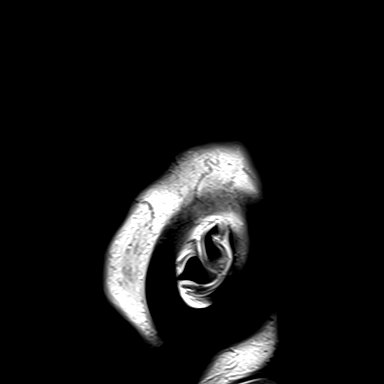

[48 of 48 positions shown; findings below may reference images not displayed]

FINDINGS: Brain: No acute infarct, acute hemorrhage or extra-axial collection.
Normal white matter signal. Normal volume of CSF spaces. No chronic
microhemorrhage. Normal midline structures.

Vascular: Normal flow voids.

Skull and upper cervical spine: Normal marrow signal.

Sinuses/Orbits: Negative.

Other: None.
IMPRESSION: Normal brain MRI.

ADDENDUM:
No abnormality of the internal auditory canals. Please note that
this was not a dedicated internal auditory canal protocol and thin
section imaging of the internal auditory canals was not performed.

*** End of Addendum ***
FINDINGS: Brain: No acute infarct, acute hemorrhage or extra-axial collection.
Normal white matter signal. Normal volume of CSF spaces. No chronic
microhemorrhage. Normal midline structures.

Vascular: Normal flow voids.

Skull and upper cervical spine: Normal marrow signal.

Sinuses/Orbits: Negative.

Other: None.
IMPRESSION: Normal brain MRI.

## 2020-07-07 ENCOUNTER — Other Ambulatory Visit: Payer: Self-pay | Admitting: Family Medicine

## 2020-07-07 DIAGNOSIS — Z131 Encounter for screening for diabetes mellitus: Secondary | ICD-10-CM

## 2020-07-07 DIAGNOSIS — Z125 Encounter for screening for malignant neoplasm of prostate: Secondary | ICD-10-CM

## 2020-07-07 DIAGNOSIS — E785 Hyperlipidemia, unspecified: Secondary | ICD-10-CM

## 2020-07-07 DIAGNOSIS — Z79899 Other long term (current) drug therapy: Secondary | ICD-10-CM

## 2020-07-18 ENCOUNTER — Other Ambulatory Visit (INDEPENDENT_AMBULATORY_CARE_PROVIDER_SITE_OTHER): Payer: BLUE CROSS/BLUE SHIELD

## 2020-07-18 ENCOUNTER — Other Ambulatory Visit: Payer: Self-pay

## 2020-07-18 DIAGNOSIS — Z79899 Other long term (current) drug therapy: Secondary | ICD-10-CM | POA: Diagnosis not present

## 2020-07-18 DIAGNOSIS — E785 Hyperlipidemia, unspecified: Secondary | ICD-10-CM | POA: Diagnosis not present

## 2020-07-18 DIAGNOSIS — Z131 Encounter for screening for diabetes mellitus: Secondary | ICD-10-CM | POA: Diagnosis not present

## 2020-07-18 DIAGNOSIS — Z125 Encounter for screening for malignant neoplasm of prostate: Secondary | ICD-10-CM

## 2020-07-18 LAB — HEPATIC FUNCTION PANEL
ALT: 14 U/L (ref 0–53)
AST: 11 U/L (ref 0–37)
Albumin: 4.1 g/dL (ref 3.5–5.2)
Alkaline Phosphatase: 91 U/L (ref 39–117)
Bilirubin, Direct: 0.2 mg/dL (ref 0.0–0.3)
Total Bilirubin: 0.8 mg/dL (ref 0.2–1.2)
Total Protein: 6.7 g/dL (ref 6.0–8.3)

## 2020-07-18 LAB — CBC WITH DIFFERENTIAL/PLATELET
Basophils Absolute: 0 10*3/uL (ref 0.0–0.1)
Basophils Relative: 0.6 % (ref 0.0–3.0)
Eosinophils Absolute: 0.1 10*3/uL (ref 0.0–0.7)
Eosinophils Relative: 2.7 % (ref 0.0–5.0)
HCT: 46.3 % (ref 39.0–52.0)
Hemoglobin: 15.7 g/dL (ref 13.0–17.0)
Lymphocytes Relative: 35.4 % (ref 12.0–46.0)
Lymphs Abs: 1.9 10*3/uL (ref 0.7–4.0)
MCHC: 33.9 g/dL (ref 30.0–36.0)
MCV: 85.3 fl (ref 78.0–100.0)
Monocytes Absolute: 0.4 10*3/uL (ref 0.1–1.0)
Monocytes Relative: 7.3 % (ref 3.0–12.0)
Neutro Abs: 2.9 10*3/uL (ref 1.4–7.7)
Neutrophils Relative %: 54 % (ref 43.0–77.0)
Platelets: 138 10*3/uL — ABNORMAL LOW (ref 150.0–400.0)
RBC: 5.42 Mil/uL (ref 4.22–5.81)
RDW: 13.9 % (ref 11.5–15.5)
WBC: 5.4 10*3/uL (ref 4.0–10.5)

## 2020-07-18 LAB — LIPID PANEL
Cholesterol: 163 mg/dL (ref 0–200)
HDL: 47.1 mg/dL (ref >39.00)
LDL Cholesterol: 103 mg/dL — ABNORMAL HIGH (ref 0–99)
NonHDL: 115.84
Total CHOL/HDL Ratio: 3
Triglycerides: 65 mg/dL (ref 0.0–149.0)
VLDL: 13 mg/dL (ref 0.0–40.0)

## 2020-07-18 LAB — BASIC METABOLIC PANEL
BUN: 27 mg/dL — ABNORMAL HIGH (ref 6–23)
CO2: 29 mEq/L (ref 19–32)
Calcium: 9.5 mg/dL (ref 8.4–10.5)
Chloride: 101 mEq/L (ref 96–112)
Creatinine, Ser: 1.11 mg/dL (ref 0.40–1.50)
GFR: 74.35 mL/min (ref >60.00)
Glucose, Bld: 111 mg/dL — ABNORMAL HIGH (ref 70–99)
Potassium: 3.9 mEq/L (ref 3.5–5.1)
Sodium: 138 mEq/L (ref 135–145)

## 2020-07-18 LAB — HEMOGLOBIN A1C: Hgb A1c MFr Bld: 5.7 % (ref 4.6–6.5)

## 2020-07-21 LAB — REFLEX PSA, FREE
PSA, % Free: 30 % (calc) (ref >25)
PSA, Free: 2.2 ng/mL

## 2020-07-21 LAB — PSA, TOTAL WITH REFLEX TO PSA, FREE: PSA, Total: 7.3 ng/mL — ABNORMAL HIGH (ref <=4.0)

## 2020-07-23 ENCOUNTER — Encounter: Payer: 59 | Admitting: Family Medicine

## 2020-07-26 NOTE — Progress Notes (Signed)
Thomas Stead T. Deegan Valentino, MD, [place] at [facility] [place], [address]  Phone: [phone]   FAX: [place] - 55 y.o. male   MRN [id number]   Date of Birth: 11/07/1965  Date: 07/28/2020   PCP: Owens Loffler, MD   Referral: Owens Loffler, MD  Chief Complaint  Patient presents with   Annual Exam    This visit occurred during the SARS-CoV-2 public health emergency.  Safety protocols were in place, including screening questions prior to the visit, additional usage of staff PPE, and extensive cleaning of exam room while observing appropriate contact time as indicated for disinfecting solutions.   Patient Care Team: Owens Loffler, MD as PCP - General Subjective:   Thomas Blair is a 55 y.o. pleasant patient who presents with the following:  Preventative Health Maintenance Visit:  Health Maintenance Summary Reviewed and updated, unless pt declines services.  Tobacco History Reviewed. Alcohol: No concerns, no excessive use Exercise Habits: Some activity, rec at least 30 mins 5 times a week -now moderate goal. STD concerns: no risk or activity to increase risk Drug Use: None  The last time that I saw this patient was having what I felt was some balance disturbance and possible ataxia.  Ultimately, he did have some evaluation by neurology ultimately by ENT.  ENT felt like this was meniere's driven. He is currently being managed by [facility]NT.  Health Maintenance  Topic Date Due   HIV Screening  Never done   INFLUENZA VACCINE  09/13/2020 (Originally 12/09/2019)   COLONOSCOPY (Pts 45-89yrs Insurance coverage will need to be confirmed)  10/29/2020   COVID-19 Vaccine (3 - Booster for Moderna series) 11/06/2020   TETANUS/TDAP  07/09/2024   Hepatitis C Screening  Completed   HPV VACCINES  Aged Out   Immunization History  Administered Date(s)  Administered   Influenza Split 02/15/2011, 03/06/2012   Influenza Whole 02/05/2009   Influenza, Seasonal, Injecte, Preservative Fre 03/11/2014   Influenza,inj,Quad PF,6+ Mos 03/19/2013, 06/09/2016   Moderna Sars-Covid-2 Vaccination 04/11/2020, 05/09/2020   Tdap 07/10/2014   Patient Active Problem List   Diagnosis Date Noted   Meniere disease, bilateral 07/29/2020   Sensorineural hearing loss (SNHL) of right ear with unrestricted hearing of left ear 06/12/2020   Prediabetes 03/20/2013   ORGANIC IMPOTENCE 05/07/2009   Hyperlipidemia LDL goal <100 02/05/2009   HYPERTENSION 01/01/2009    Past Medical History:  Diagnosis Date   Allergy    GERD (gastroesophageal reflux disease)    Hernia    History of chicken pox    Hyperlipidemia    Hypertension    Meniere disease    Prediabetes 03/20/2013    Past Surgical History:  Procedure Laterality Date   HERNIA REPAIR      Family History  Problem Relation Age of Onset   Colon cancer Neg Hx    Colon polyps Neg Hx    Esophageal cancer Neg Hx    Rectal cancer Neg Hx    Stomach cancer Neg Hx     Past Medical History, Surgical History, Social History, Family History, Problem List, Medications, and Allergies have been reviewed and updated if relevant.  Review of Systems: Pertinent positives are listed above.  Otherwise, a full 14 point review of systems has been done in full and it is negative except where it is noted positive.  Objective:   BP 110/72    Pulse Marland Kitchen)  51    Temp 97.6 F (36.4 C) (Temporal)    Ht 5' 6.25" (1.683 m)    Wt 185 lb 12 oz (84.3 kg)    SpO2 96%    BMI 29.75 kg/m  Ideal Body Weight: Weight in (lb) to have BMI = 25: 155.7  Ideal Body Weight: Weight in (lb) to have BMI = 25: 155.7 No exam data present Depression screen [facility] 2/9 07/28/2020 07/23/2019 07/19/2018 07/18/2017  Decreased Interest 0 0 0 0  Down, Depressed, Hopeless 0 0 0 0  PHQ - 2 Score 0 0 0 0     GEN: well developed, well  nourished, no acute distress Eyes: conjunctiva and lids normal, PERRLA, EOMI ENT: TM clear, nares clear, oral exam WNL Neck: supple, no lymphadenopathy, no thyromegaly, no JVD Pulm: clear to auscultation and percussion, respiratory effort normal CV: regular rate and rhythm, S1-S2, no murmur, rub or gallop, no bruits, peripheral pulses normal and symmetric, no cyanosis, clubbing, edema or varicosities GI: soft, non-tender; no hepatosplenomegaly, masses; active bowel sounds all quadrants GU: deferred Lymph: no cervical, axillary or inguinal adenopathy MSK: gait normal, muscle tone and strength WNL, no joint swelling, effusions, discoloration, crepitus  SKIN: clear, good turgor, color WNL, no rashes, lesions, or ulcerations Neuro: normal mental status, normal strength, sensation, and motion Psych: alert; oriented to person, place and time, normally interactive and not anxious or depressed in appearance.  All labs reviewed with patient. Results for orders placed or performed in visit on 07/18/20  Hemoglobin A1c  Result Value Ref Range   Hgb A1c MFr Bld 5.7 4.6 - 6.5 %  PSA, Total with Reflex to PSA, Free  Result Value Ref Range   PSA, Total 7.3 (H) < OR = 4.0 ng/mL  CBC with Differential/Platelet  Result Value Ref Range   WBC 5.4 4.0 - 10.5 K/uL   RBC 5.42 4.22 - 5.81 Mil/uL   Hemoglobin 15.7 13.0 - 17.0 g/dL   HCT 46.3 39.0 - 52.0 %   MCV 85.3 78.0 - 100.0 fl   MCHC 33.9 30.0 - 36.0 g/dL   RDW 13.9 11.5 - 15.5 %   Platelets 138.0 (L) 150.0 - 400.0 K/uL   Neutrophils Relative % 54.0 43.0 - 77.0 %   Lymphocytes Relative 35.4 12.0 - 46.0 %   Monocytes Relative 7.3 3.0 - 12.0 %   Eosinophils Relative 2.7 0.0 - 5.0 %   Basophils Relative 0.6 0.0 - 3.0 %   Neutro Abs 2.9 1.4 - 7.7 K/uL   Lymphs Abs 1.9 0.7 - 4.0 K/uL   Monocytes Absolute 0.4 0.1 - 1.0 K/uL   Eosinophils Absolute 0.1 0.0 - 0.7 K/uL   Basophils Absolute 0.0 0.0 - 0.1 K/uL  Basic metabolic panel  Result Value Ref Range    Sodium 138 135 - 145 mEq/L   Potassium 3.9 3.5 - 5.1 mEq/L   Chloride 101 96 - 112 mEq/L   CO2 29 19 - 32 mEq/L   Glucose, Bld 111 (H) 70 - 99 mg/dL   BUN 27 (H) 6 - 23 mg/dL   Creatinine, Ser 1.11 0.40 - 1.50 mg/dL   GFR 74.35 >60.00 mL/min   Calcium 9.5 8.4 - 10.5 mg/dL  Hepatic function panel  Result Value Ref Range   Total Bilirubin 0.8 0.2 - 1.2 mg/dL   Bilirubin, Direct 0.2 0.0 - 0.3 mg/dL   Alkaline Phosphatase 91 39 - 117 U/L   AST 11 0 - 37 U/L   ALT 14 0 -  53 U/L   Total Protein 6.7 6.0 - 8.3 g/dL   Albumin 4.1 3.5 - 5.2 g/dL  Lipid panel  Result Value Ref Range   Cholesterol 163 0 - 200 mg/dL   Triglycerides 65.0 0.0 - 149.0 mg/dL   HDL 47.10 >39.00 mg/dL   VLDL 13.0 0.0 - 40.0 mg/dL   LDL Cholesterol 103 (H) 0 - 99 mg/dL   Total CHOL/HDL Ratio 3    NonHDL 115.84   reflex PSA, Free  Result Value Ref Range   PSA, Free 2.2 ng/mL   PSA, % Free 30 >25 % (calc)    Assessment and Plan:     ICD-10-CM   1. Healthcare maintenance  Z00.00    His primary issue right now has been Mnire's disease dysfunction over the last 6 months.  He is working with [facility].  All other issues are up-to-date, and I congratulated him on his weight loss. He does have urology follow-up with his elevated PSA as well.  Health Maintenance Exam: The patient's preventative maintenance and recommended screening tests for an annual wellness exam were reviewed in full today. Brought up to date unless services declined.  Counselled on the importance of diet, exercise, and its role in overall health and mortality. The patient's FH and SH was reviewed, including their home life, tobacco status, and drug and alcohol status.  Follow-up in 1 year for physical exam or additional follow-up below.  Follow-up: No follow-ups on file. Or follow-up in 1 year if not noted.  Meds ordered this encounter  Medications   DISCONTD: amLODipine (NORVASC) 10 MG tablet    Sig: Take 1 tablet (10 mg total)  by mouth daily.    Dispense:  90 tablet    Refill:  3   metoprolol tartrate (LOPRESSOR) 25 MG tablet    Sig: Take 1 tablet (25 mg total) by mouth 2 (two) times daily.    Dispense:  180 tablet    Refill:  3   sildenafil (REVATIO) 20 MG tablet    Sig: TAKE 2 TO 5 TABLETS 30 MIN PRIOR TO INTERCOURSE    Dispense:  50 tablet    Refill:  11   Medications Discontinued During This Encounter  Medication Reason   diazepam (VALIUM) 2 MG tablet Completed Course   silodosin (RAPAFLO) 4 MG CAPS capsule Completed Course   amLODipine (NORVASC) 10 MG tablet Reorder   metoprolol tartrate (LOPRESSOR) 25 MG tablet Reorder   sildenafil (REVATIO) 20 MG tablet Reorder   amLODipine (NORVASC) 10 MG tablet    No orders of the defined types were placed in this encounter.   Signed,  Maud Deed. Loel Betancur, MD   Allergies as of 07/28/2020      Reactions   Penicillins    REACTION: rash   Latex Rash      Medication List       Accurate as of July 28, 2020 11:59 PM. If you have any questions, ask your nurse or doctor.        STOP taking these medications   amLODipine 10 MG tablet Commonly known as: NORVASC Stopped by: Owens Loffler, MD   diazepam 2 MG tablet Commonly known as: Valium Stopped by: Owens Loffler, MD   silodosin 4 MG Caps capsule Commonly known as: RAPAFLO Stopped by: Owens Loffler, MD     TAKE these medications   fluticasone 50 MCG/ACT nasal spray Commonly known as: FLONASE Place 2 sprays into both nostrils daily.   ibuprofen 200 MG tablet Commonly  known as: ADVIL Take 200 mg by mouth every 6 (six) hours as needed. Reported on 10/29/2015   metoprolol tartrate 25 MG tablet Commonly known as: LOPRESSOR Take 1 tablet (25 mg total) by mouth 2 (two) times daily.   multivitamin tablet Take 1 tablet by mouth daily. [employer]   sildenafil 20 MG tablet Commonly known as: REVATIO TAKE 2 TO 5 TABLETS 30 MIN PRIOR TO INTERCOURSE   tamsulosin 0.4 MG Caps  capsule Commonly known as: FLOMAX Take 0.4 mg by mouth daily.   triamterene-hydrochlorothiazide 37.5-25 MG tablet Commonly known as: MAXZIDE-25 Take 1 tablet by mouth daily.

## 2020-07-28 ENCOUNTER — Ambulatory Visit (INDEPENDENT_AMBULATORY_CARE_PROVIDER_SITE_OTHER): Payer: BLUE CROSS/BLUE SHIELD | Admitting: Family Medicine

## 2020-07-28 ENCOUNTER — Other Ambulatory Visit: Payer: Self-pay

## 2020-07-28 ENCOUNTER — Encounter: Payer: Self-pay | Admitting: Family Medicine

## 2020-07-28 VITALS — BP 110/72 | HR 51 | Temp 97.6°F | Ht 66.25 in | Wt 185.8 lb

## 2020-07-28 DIAGNOSIS — Z0000 Encounter for general adult medical examination without abnormal findings: Secondary | ICD-10-CM

## 2020-07-28 MED ORDER — AMLODIPINE BESYLATE 10 MG PO TABS
10.0000 mg | ORAL_TABLET | Freq: Every day | ORAL | 3 refills | Status: DC
Start: 2020-07-28 — End: 2020-07-28

## 2020-07-28 MED ORDER — METOPROLOL TARTRATE 25 MG PO TABS: 25.0000 mg | ORAL_TABLET | Freq: Two times a day (BID) | ORAL | 3 refills | Status: DC

## 2020-07-28 MED ORDER — SILDENAFIL CITRATE 20 MG PO TABS: ORAL_TABLET | ORAL | 11 refills | Status: DC

## 2020-07-28 NOTE — Patient Instructions (Signed)
Stop Amlodipine 10 mg.  Check your blood pressure twice a week.

## 2020-07-29 ENCOUNTER — Encounter: Payer: Self-pay | Admitting: Family Medicine

## 2020-07-29 DIAGNOSIS — H8103 Meniere's disease, bilateral: Secondary | ICD-10-CM | POA: Insufficient documentation

## 2020-08-13 ENCOUNTER — Telehealth: Payer: Self-pay | Admitting: Family Medicine

## 2020-08-13 NOTE — Telephone Encounter (Signed)
Wife called in wanted to know about getting his medical records to fill out disability. They are needing every test he had done.   Please advise

## 2020-08-13 NOTE — Telephone Encounter (Signed)
Spoke with Mrs. Lindenbaum and advised her that they would need to sign a records release prior to getting records.  She would like release mailed to her at [email or web address].  Will forward message for Donzetta Matters to email release form.

## 2020-08-18 NOTE — Telephone Encounter (Signed)
I have emailed this over

## 2020-10-14 ENCOUNTER — Other Ambulatory Visit: Payer: Self-pay | Admitting: Family Medicine

## 2020-10-14 DIAGNOSIS — G3281 Cerebellar ataxia in diseases classified elsewhere: Secondary | ICD-10-CM

## 2020-10-14 DIAGNOSIS — F688 Other specified disorders of adult personality and behavior: Secondary | ICD-10-CM

## 2020-10-14 DIAGNOSIS — R29818 Other symptoms and signs involving the nervous system: Secondary | ICD-10-CM

## 2020-10-14 DIAGNOSIS — R42 Dizziness and giddiness: Secondary | ICD-10-CM

## 2020-10-14 DIAGNOSIS — H9313 Tinnitus, bilateral: Secondary | ICD-10-CM

## 2020-10-14 DIAGNOSIS — H814 Vertigo of central origin: Secondary | ICD-10-CM

## 2020-10-14 DIAGNOSIS — R270 Ataxia, unspecified: Secondary | ICD-10-CM

## 2020-10-14 DIAGNOSIS — R2990 Unspecified symptoms and signs involving the nervous system: Secondary | ICD-10-CM

## 2020-10-14 DIAGNOSIS — R413 Other amnesia: Secondary | ICD-10-CM

## 2020-10-14 DIAGNOSIS — H906 Mixed conductive and sensorineural hearing loss, bilateral: Secondary | ICD-10-CM

## 2020-10-14 DIAGNOSIS — H8109 Meniere's disease, unspecified ear: Secondary | ICD-10-CM

## 2020-12-08 ENCOUNTER — Encounter: Payer: Self-pay | Admitting: Gastroenterology

## 2021-03-03 ENCOUNTER — Telehealth: Payer: Self-pay | Admitting: Family Medicine

## 2021-03-03 DIAGNOSIS — L989 Disorder of the skin and subcutaneous tissue, unspecified: Secondary | ICD-10-CM

## 2021-03-03 NOTE — Telephone Encounter (Signed)
Patient's wife has called requesting a referral to put in for [facility]. She has called them to make her own appt but they are only accepting referrals at the moment.  The pt has a spot on his face that seems to be getting larger, changing shape & size   Please advise

## 2021-03-04 NOTE — Telephone Encounter (Signed)
I placed that consult for him.

## 2021-03-05 ENCOUNTER — Encounter: Payer: Self-pay | Admitting: *Deleted

## 2021-03-09 NOTE — Telephone Encounter (Signed)
Message sent to patient via Mychart  Referral and OV notes faxed via ROI. Faxed to [phone]    [email or web address]  Virl Cagey, CMA on 03/05/2021 1124 - delivered at 03/05/2021 1124  Referral was sent to  [facility] and [place] at [facility] [address] [place], [place] [address] Get Driving Directions  Main Phone:[phone] Fax: [phone]  They also have a location in [place] but I was unable to find that location in [place]. The patient can call to schedule at either location.    [facility] and [place] at [place], [place] [address] Phone: [phone]

## 2021-04-22 ENCOUNTER — Telehealth: Payer: BLUE CROSS/BLUE SHIELD | Admitting: Family Medicine

## 2021-04-22 ENCOUNTER — Other Ambulatory Visit: Payer: Self-pay

## 2021-07-22 ENCOUNTER — Other Ambulatory Visit: Payer: Self-pay

## 2021-07-22 ENCOUNTER — Telehealth: Payer: Self-pay | Admitting: *Deleted

## 2021-07-22 ENCOUNTER — Other Ambulatory Visit (INDEPENDENT_AMBULATORY_CARE_PROVIDER_SITE_OTHER): Payer: BLUE CROSS/BLUE SHIELD

## 2021-07-22 DIAGNOSIS — Z114 Encounter for screening for human immunodeficiency virus [HIV]: Secondary | ICD-10-CM

## 2021-07-22 DIAGNOSIS — Z79899 Other long term (current) drug therapy: Secondary | ICD-10-CM

## 2021-07-22 DIAGNOSIS — Z131 Encounter for screening for diabetes mellitus: Secondary | ICD-10-CM

## 2021-07-22 DIAGNOSIS — R972 Elevated prostate specific antigen [PSA]: Secondary | ICD-10-CM

## 2021-07-22 DIAGNOSIS — Z13220 Encounter for screening for lipoid disorders: Secondary | ICD-10-CM | POA: Diagnosis not present

## 2021-07-22 LAB — BASIC METABOLIC PANEL
BUN: 22 mg/dL (ref 6–23)
CO2: 30 mEq/L (ref 19–32)
Calcium: 9.8 mg/dL (ref 8.4–10.5)
Chloride: 100 mEq/L (ref 96–112)
Creatinine, Ser: 1.27 mg/dL (ref 0.40–1.50)
GFR: 62.81 mL/min (ref >60.00)
Glucose, Bld: 120 mg/dL — ABNORMAL HIGH (ref 70–99)
Potassium: 3.8 mEq/L (ref 3.5–5.1)
Sodium: 139 mEq/L (ref 135–145)

## 2021-07-22 LAB — HEPATIC FUNCTION PANEL
ALT: 22 U/L (ref 0–53)
AST: 15 U/L (ref 0–37)
Albumin: 4.6 g/dL (ref 3.5–5.2)
Alkaline Phosphatase: 86 U/L (ref 39–117)
Bilirubin, Direct: 0.2 mg/dL (ref 0.0–0.3)
Total Bilirubin: 1.1 mg/dL (ref 0.2–1.2)
Total Protein: 7.1 g/dL (ref 6.0–8.3)

## 2021-07-22 LAB — CBC WITH DIFFERENTIAL/PLATELET
Basophils Absolute: 0 10*3/uL (ref 0.0–0.1)
Basophils Relative: 0.4 % (ref 0.0–3.0)
Eosinophils Absolute: 0.1 10*3/uL (ref 0.0–0.7)
Eosinophils Relative: 1.6 % (ref 0.0–5.0)
HCT: 53.9 % — ABNORMAL HIGH (ref 39.0–52.0)
Hemoglobin: 18.1 g/dL (ref 13.0–17.0)
Lymphocytes Relative: 44.8 % (ref 12.0–46.0)
Lymphs Abs: 3 10*3/uL (ref 0.7–4.0)
MCHC: 33.6 g/dL (ref 30.0–36.0)
MCV: 86.2 fl (ref 78.0–100.0)
Monocytes Absolute: 0.6 10*3/uL (ref 0.1–1.0)
Monocytes Relative: 8.8 % (ref 3.0–12.0)
Neutro Abs: 3 10*3/uL (ref 1.4–7.7)
Neutrophils Relative %: 44.4 % (ref 43.0–77.0)
Platelets: 135 10*3/uL — ABNORMAL LOW (ref 150.0–400.0)
RBC: 6.25 Mil/uL — ABNORMAL HIGH (ref 4.22–5.81)
RDW: 14.4 % (ref 11.5–15.5)
WBC: 6.8 10*3/uL (ref 4.0–10.5)

## 2021-07-22 LAB — LIPID PANEL
Cholesterol: 181 mg/dL (ref 0–200)
HDL: 42.7 mg/dL (ref >39.00)
LDL Cholesterol: 102 mg/dL — ABNORMAL HIGH (ref 0–99)
NonHDL: 138.77
Total CHOL/HDL Ratio: 4
Triglycerides: 182 mg/dL — ABNORMAL HIGH (ref 0.0–149.0)
VLDL: 36.4 mg/dL (ref 0.0–40.0)

## 2021-07-22 LAB — HEMOGLOBIN A1C: Hgb A1c MFr Bld: 6 % (ref 4.6–6.5)

## 2021-07-22 NOTE — Addendum Note (Signed)
Addended by: Carter Kitten on: 07/22/2021 11:14 AM ? ? Modules accepted: Orders ? ?

## 2021-07-22 NOTE — Telephone Encounter (Signed)
Not really critical.  I will talk to him about it at [place]. ?

## 2021-07-22 NOTE — Telephone Encounter (Signed)
Received call from [facility] lab with critical Hgb of 18.1 g/dl. ?

## 2021-07-29 ENCOUNTER — Encounter: Payer: Self-pay | Admitting: Family Medicine

## 2021-07-29 ENCOUNTER — Other Ambulatory Visit: Payer: Self-pay

## 2021-07-29 ENCOUNTER — Ambulatory Visit (INDEPENDENT_AMBULATORY_CARE_PROVIDER_SITE_OTHER): Payer: BLUE CROSS/BLUE SHIELD | Admitting: Family Medicine

## 2021-07-29 VITALS — BP 120/80 | HR 59 | Temp 98.3°F | Ht 66.0 in | Wt 195.6 lb

## 2021-07-29 DIAGNOSIS — Z0000 Encounter for general adult medical examination without abnormal findings: Secondary | ICD-10-CM | POA: Diagnosis not present

## 2021-07-29 MED ORDER — METOPROLOL TARTRATE 25 MG PO TABS
25.0000 mg | ORAL_TABLET | Freq: Two times a day (BID) | ORAL | 3 refills | Status: AC
Start: 2021-07-29 — End: ?

## 2021-07-29 MED ORDER — SILDENAFIL CITRATE 20 MG PO TABS
ORAL_TABLET | ORAL | 11 refills | Status: AC
Start: 2021-07-29 — End: ?

## 2021-07-29 NOTE — Patient Instructions (Signed)
Check on with your insurance: ? ?Colonoscopy coverage ? ?Get your Shingrix vaccine at the pharmacy ? ?Check on the vestibular rehab - they do it at [place] ? ? ?

## 2021-07-29 NOTE — Progress Notes (Signed)
? ? ?Thomas Wedekind T. Tere Mcconaughey, MD, [facility] ?Therapist, music at [facility] ?[place] ?[place], [address] ? ?Phone: [phone]  FAX: [phone] ? ?Thomas Blair - 56 y.o. male  MRN [id number]  Date of Birth: May 04, 1966 ? ?Date: 07/29/2021  PCP: Thomas Loffler, MD  Referral: Thomas Loffler, MD ? ?Chief Complaint  ?Patient presents with  ? Annual Exam  ? ? ?This visit occurred during the SARS-CoV-2 public health emergency.  Safety protocols were in place, including screening questions prior to the visit, additional usage of staff PPE, and extensive cleaning of exam room while observing appropriate contact time as indicated for disinfecting solutions.  ? ?Patient Care Team: ?Thomas Loffler, MD as PCP - General ?Subjective:  ? ?Thomas Blair is a 56 y.o. pleasant patient who presents with the following: ? ?Preventative Health Maintenance Visit: ? ?Health Maintenance Summary Reviewed and updated, unless pt declines services. ? ?Tobacco History Reviewed. ?Alcohol: No concerns, no excessive use ?Exercise Habits: Some activity, rec at least 30 mins 5 times a week ?STD concerns: no risk or activity to increase risk ?Drug Use: None ? ?Very nice patient, well-known for many years.  He presents today for an annual wellness exam. ? ?Meniere's did have a shunt.  ?About sixty or sventy percent better.  ?He is really being plagued by many years.  He has not been able to work for more than a year.  He is having symptoms all the time.  Did have a shunt, and this is helping, but he is still impaired all of the time.  He will have symptoms that often last an hour plus.  Basic things such as his driving movement needed busy backgrounds are having him have a lot of symptoms. ? ?PSA.  He is still seeing urology. ? ?Health Maintenance  ?Topic Date Due  ? Zoster Vaccines- Shingrix (1 of 2) Never done  ? COVID-19 Vaccine (3 - Moderna risk series) 06/06/2020  ? COLONOSCOPY (Pts 45-26yr  Insurance coverage will need to be confirmed)  10/29/2020  ? INFLUENZA VACCINE  08/07/2021 (Originally 12/08/2020)  ? TETANUS/TDAP  07/09/2024  ? Hepatitis C Screening  Completed  ? HIV Screening  Completed  ? HPV VACCINES  Aged Out  ? ?Immunization History  ?Administered Date(s) Administered  ? Influenza Split 02/15/2011, 03/06/2012  ? Influenza Whole 02/05/2009  ? Influenza, Seasonal, Injecte, Preservative Fre 03/11/2014  ? Influenza,inj,Quad PF,6+ Mos 03/19/2013, 06/09/2016  ? Moderna Sars-Covid-2 Vaccination 04/11/2020, 05/09/2020  ? Tdap 07/10/2014  ? ?Patient Active Problem List  ? Diagnosis Date Noted  ? Meniere disease, bilateral 07/29/2020  ? Sensorineural hearing loss (SNHL) of right ear with unrestricted hearing of left ear 06/12/2020  ? ORGANIC IMPOTENCE 05/07/2009  ? Hyperlipidemia LDL goal <100 02/05/2009  ? HYPERTENSION 01/01/2009  ? ? ?Past Medical History:  ?Diagnosis Date  ? GERD (gastroesophageal reflux disease)   ? Hernia   ? Hyperlipidemia   ? Hypertension   ? Meniere disease   ? Prediabetes 03/20/2013  ? ? ?Past Surgical History:  ?Procedure Laterality Date  ? HERNIA REPAIR    ? ? ?Family History  ?Problem Relation Age of Onset  ? Colon cancer Neg Hx   ? Colon polyps Neg Hx   ? Esophageal cancer Neg Hx   ? Rectal cancer Neg Hx   ? Stomach cancer Neg Hx   ? ? ?Past Medical History, Surgical History, Social History, Family History, Problem List, Medications, and Allergies have been reviewed and updated  if relevant. ? ?Review of Systems: Pertinent positives are listed above.  Otherwise, a full 14 point review of systems has been done in full and it is negative except where it is noted positive. ? ?Objective:  ? ?BP 120/80   Pulse (!) 59   Temp 98.3 ?F (36.8 ?C) (Oral)   Ht '5\' 6"'$  (1.676 m)   Wt 195 lb 9 oz (88.7 kg)   SpO2 96%   BMI 31.56 kg/m?  ?Ideal Body Weight: Weight in (lb) to have BMI = 25: 154.6 ? ?Ideal Body Weight: Weight in (lb) to have BMI = 25: 154.6 ?No results found. ? ?   07/29/2021  ?  8:23 AM 07/28/2020  ?  8:32 AM 07/23/2019  ?  9:10 AM 07/19/2018  ?  8:32 AM 07/18/2017  ?  8:40 AM  ?Depression screen PHQ 2/9  ?Decreased Interest 0 0 0 0 0  ?Down, Depressed, Hopeless 0 0 0 0 0  ?PHQ - 2 Score 0 0 0 0 0  ? ? ? ?GEN: well developed, well nourished, no acute distress ?Eyes: conjunctiva and lids normal, PERRLA, EOMI ?ENT: TM clear, nares clear, oral exam WNL ?Neck: supple, no lymphadenopathy, no thyromegaly, no JVD ?Pulm: clear to auscultation and percussion, respiratory effort normal ?CV: regular rate and rhythm, S1-S2, no murmur, rub or gallop, no bruits, peripheral pulses normal and symmetric, no cyanosis, clubbing, edema or varicosities ?GI: soft, non-tender; no hepatosplenomegaly, masses; active bowel sounds all quadrants ?GU: deferred ?Lymph: no cervical, axillary or inguinal adenopathy ?MSK: gait normal, muscle tone and strength WNL, no joint swelling, effusions, discoloration, crepitus  ?SKIN: clear, good turgor, color WNL, no rashes, lesions, or ulcerations ?Neuro: normal mental status, normal strength, sensation, and motion ?Psych: alert; oriented to person, place and time, normally interactive and not anxious or depressed in appearance. ? ?All labs reviewed with patient. ?Results for orders placed or performed in visit on 07/22/21  ?CBC with Differential/Platelet  ?Result Value Ref Range  ? WBC 6.8 4.0 - 10.5 K/uL  ? RBC 6.25 (H) 4.22 - 5.81 Mil/uL  ? Hemoglobin 18.1 Repeated and verified X2. (HH) 13.0 - 17.0 g/dL  ? HCT 53.9 (H) 39.0 - 52.0 %  ? MCV 86.2 78.0 - 100.0 fl  ? MCHC 33.6 30.0 - 36.0 g/dL  ? RDW 14.4 11.5 - 15.5 %  ? Platelets 135.0 (L) 150.0 - 400.0 K/uL  ? Neutrophils Relative % 44.4 43.0 - 77.0 %  ? Lymphocytes Relative 44.8 12.0 - 46.0 %  ? Monocytes Relative 8.8 3.0 - 12.0 %  ? Eosinophils Relative 1.6 0.0 - 5.0 %  ? Basophils Relative 0.4 0.0 - 3.0 %  ? Neutro Abs 3.0 1.4 - 7.7 K/uL  ? Lymphs Abs 3.0 0.7 - 4.0 K/uL  ? Monocytes Absolute 0.6 0.1 - 1.0 K/uL  ?  Eosinophils Absolute 0.1 0.0 - 0.7 K/uL  ? Basophils Absolute 0.0 0.0 - 0.1 K/uL  ?Basic metabolic panel  ?Result Value Ref Range  ? Sodium 139 135 - 145 mEq/L  ? Potassium 3.8 3.5 - 5.1 mEq/L  ? Chloride 100 96 - 112 mEq/L  ? CO2 30 19 - 32 mEq/L  ? Glucose, Bld 120 (H) 70 - 99 mg/dL  ? BUN 22 6 - 23 mg/dL  ? Creatinine, Ser 1.27 0.40 - 1.50 mg/dL  ? GFR 62.81 >60.00 mL/min  ? Calcium 9.8 8.4 - 10.5 mg/dL  ?Lipid panel  ?Result Value Ref Range  ? Cholesterol 181 0 - 200  mg/dL  ? Triglycerides 182.0 (H) 0.0 - 149.0 mg/dL  ? HDL 42.70 >39.00 mg/dL  ? VLDL 36.4 0.0 - 40.0 mg/dL  ? LDL Cholesterol 102 (H) 0 - 99 mg/dL  ? Total CHOL/HDL Ratio 4   ? NonHDL 138.77   ?Hemoglobin A1c  ?Result Value Ref Range  ? Hgb A1c MFr Bld 6.0 4.6 - 6.5 %  ?Hepatic Function Panel  ?Result Value Ref Range  ? Total Bilirubin 1.1 0.2 - 1.2 mg/dL  ? Bilirubin, Direct 0.2 0.0 - 0.3 mg/dL  ? Alkaline Phosphatase 86 39 - 117 U/L  ? AST 15 0 - 37 U/L  ? ALT 22 0 - 53 U/L  ? Total Protein 7.1 6.0 - 8.3 g/dL  ? Albumin 4.6 3.5 - 5.2 g/dL  ? ? ?Assessment and Plan:  ? ?  ICD-10-CM   ?1. Healthcare maintenance  Z00.00   ?  ? ?He is generally doing pretty well with the exception of his M?ni?re's which has been awfully challenging for him.  We talked about this in from what I hear the only thing that he really has not tried is some vestibular rehab.  I asked him to check with his insurance, they do do a good job with this at [facility]. ? ?Blood pressure stable. ?Lipids are stable. ? ? ? ?Health Maintenance Exam: The patient's preventative maintenance and recommended screening tests for an annual wellness exam were reviewed in full today. ?Brought up to date unless services declined. ? ?Counselled on the importance of diet, exercise, and its role in overall health and mortality. ?The patient's FH and SH was reviewed, including their home life, tobacco status, and drug and alcohol status. ? ?Follow-up in 1 year for physical exam or additional  follow-up below. ? ?Follow-up: No follow-ups on file. ?Or follow-up in 1 year if not noted. ? ?Meds ordered this encounter  ?Medications  ? metoprolol tartrate (LOPRESSOR) 25 MG tablet  ?  Sig: Take 1 tablet (25 mg total) by mo

## 2022-04-26 ENCOUNTER — Telehealth: Payer: Self-pay

## 2022-04-26 NOTE — Telephone Encounter (Signed)
Transition Care Management Unsuccessful Follow-up Telephone Call  Date of discharge and from where:  [facility] 04/17/22  Attempts:  1st Attempt  Reason for unsuccessful TCM follow-up call:  Left voice message

## 2022-09-01 ENCOUNTER — Other Ambulatory Visit: Payer: Self-pay | Admitting: Family Medicine

## 2022-09-10 ENCOUNTER — Encounter: Payer: Self-pay | Admitting: Gastroenterology
